# Patient Record
Sex: Female | Born: 1995
Health system: Southern US, Community
[De-identification: ages and names within clinical notes are randomized; demographics above are authoritative.]

## PROBLEM LIST (undated history)

## (undated) DIAGNOSIS — G932 Benign intracranial hypertension: Secondary | ICD-10-CM

## (undated) DIAGNOSIS — E669 Obesity, unspecified: Secondary | ICD-10-CM

## (undated) DIAGNOSIS — N926 Irregular menstruation, unspecified: Secondary | ICD-10-CM

## (undated) DIAGNOSIS — Z8619 Personal history of other infectious and parasitic diseases: Secondary | ICD-10-CM

## (undated) DIAGNOSIS — N898 Other specified noninflammatory disorders of vagina: Secondary | ICD-10-CM

## (undated) DIAGNOSIS — N760 Acute vaginitis: Secondary | ICD-10-CM

## (undated) DIAGNOSIS — B9689 Other specified bacterial agents as the cause of diseases classified elsewhere: Secondary | ICD-10-CM

## (undated) DIAGNOSIS — G43909 Migraine, unspecified, not intractable, without status migrainosus: Secondary | ICD-10-CM

## (undated) HISTORY — DX: Other specified bacterial agents as the cause of diseases classified elsewhere: B96.89

## (undated) HISTORY — DX: Other specified noninflammatory disorders of vagina: N89.8

## (undated) HISTORY — DX: Acute vaginitis: N76.0

## (undated) HISTORY — DX: Irregular menstruation, unspecified: N92.6

## (undated) HISTORY — DX: Personal history of other infectious and parasitic diseases: Z86.19

## (undated) HISTORY — PX: TONSILLECTOMY: SUR1361

## (undated) HISTORY — DX: Obesity, unspecified: E66.9

---
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
## 2014-01-15 ENCOUNTER — Telehealth: Payer: Self-pay | Admitting: *Deleted

## 2014-01-19 NOTE — Telephone Encounter (Signed)
Left messages x 3

## 2014-03-19 ENCOUNTER — Ambulatory Visit: Payer: Medicaid Other | Admitting: Adult Health

## 2014-03-19 ENCOUNTER — Encounter: Payer: Self-pay | Admitting: *Deleted

## 2014-03-20 ENCOUNTER — Emergency Department (HOSPITAL_COMMUNITY): Payer: Medicaid Other

## 2014-03-20 ENCOUNTER — Encounter (HOSPITAL_COMMUNITY): Payer: Self-pay | Admitting: Emergency Medicine

## 2014-03-20 ENCOUNTER — Emergency Department (HOSPITAL_COMMUNITY)
Admission: EM | Admit: 2014-03-20 | Discharge: 2014-03-21 | Disposition: A | Discharge status: Home / Self Care | Payer: Medicaid Other | Attending: Emergency Medicine | Admitting: Emergency Medicine

## 2014-03-20 DIAGNOSIS — Z8619 Personal history of other infectious and parasitic diseases: Secondary | ICD-10-CM | POA: Insufficient documentation

## 2014-03-20 DIAGNOSIS — Z793 Long term (current) use of hormonal contraceptives: Secondary | ICD-10-CM | POA: Insufficient documentation

## 2014-03-20 DIAGNOSIS — R51 Headache: Secondary | ICD-10-CM | POA: Diagnosis not present

## 2014-03-20 DIAGNOSIS — R519 Headache, unspecified: Secondary | ICD-10-CM

## 2014-03-20 DIAGNOSIS — Z3202 Encounter for pregnancy test, result negative: Secondary | ICD-10-CM | POA: Insufficient documentation

## 2014-03-20 LAB — POC URINE PREG, ED: Preg Test, Ur: NEGATIVE

## 2014-03-20 MED ORDER — PROMETHAZINE HCL 12.5 MG PO TABS
12.5000 mg | ORAL_TABLET | Freq: Once | ORAL | Status: AC
  Administered 2014-03-20: 12.5 mg via ORAL
  Filled 2014-03-20: qty 1

## 2014-03-20 MED ORDER — KETOROLAC TROMETHAMINE 10 MG PO TABS
10.0000 mg | ORAL_TABLET | Freq: Once | ORAL | Status: AC
  Administered 2014-03-20: 10 mg via ORAL
  Filled 2014-03-20: qty 1

## 2014-03-20 MED ORDER — HYDROCODONE-ACETAMINOPHEN 5-325 MG PO TABS
2.0000 | ORAL_TABLET | Freq: Once | ORAL | Status: AC
Start: 2014-03-20 — End: 2014-03-20
  Administered 2014-03-20: 2 via ORAL
  Filled 2014-03-20: qty 2

## 2014-03-20 MED ORDER — IBUPROFEN 600 MG PO TABS: 600.0000 mg | ORAL_TABLET | Freq: Four times a day (QID) | ORAL | Status: DC

## 2014-03-20 MED ORDER — HYDROCODONE-ACETAMINOPHEN 5-325 MG PO TABS
1.0000 | ORAL_TABLET | ORAL | Status: DC | PRN
Start: 2014-03-20 — End: 2014-04-17

## 2014-03-20 NOTE — ED Notes (Signed)
Pt c/o intermittent headaches x 2 months. States they only last for a few seconds but come several times a day. States they have increased in frequency and intensity with tonight being the worse. Denies any N/V, photophobia, blurry vision. Reports occasional lightheadedness.

## 2014-03-20 NOTE — ED Notes (Signed)
Pt c/o head pain intermittent over last 2 months, but got worse tonight.

## 2014-03-20 NOTE — ED Notes (Signed)
PA at bedside.

## 2014-03-20 NOTE — Discharge Instructions (Signed)
Your CT scan and exam are negative for ACUTE problems.  Headaches, Frequently Asked Questions MIGRAINE HEADACHES Q: What is migraine? What causes it? How can I treat it? A: Generally, migraine headaches begin as a dull ache. Then they develop into a constant, throbbing, and pulsating pain. You may experience pain at the temples. You may experience pain at the front or back of one or both sides of the head. The pain is usually accompanied by a combination of:  Nausea.  Vomiting.  Sensitivity to light and noise. Some people (about 15%) experience an aura (see below) before an attack. The cause of migraine is believed to be chemical reactions in the brain. Treatment for migraine may include over-the-counter or prescription medications. It may also include self-help techniques. These include relaxation training and biofeedback.  Q: What is an aura? A: About 15% of people with migraine get an "aura". This is a sign of neurological symptoms that occur before a migraine headache. You may see wavy or jagged lines, dots, or flashing lights. You might experience tunnel vision or blind spots in one or both eyes. The aura can include visual or auditory hallucinations (something imagined). It may include disruptions in smell (such as strange odors), taste or touch. Other symptoms include:  Numbness.  A "pins and needles" sensation.  Difficulty in recalling or speaking the correct word. These neurological events may last as long as 60 minutes. These symptoms will fade as the headache begins. Q: What is a trigger? A: Certain physical or environmental factors can lead to or "trigger" a migraine. These include:  Foods.  Hormonal changes.  Weather.  Stress. It is important to remember that triggers are different for everyone. To help prevent migraine attacks, you need to figure out which triggers affect you. Keep a headache diary. This is a good way to track triggers. The diary will help you talk to  your healthcare professional about your condition. Q: Does weather affect migraines? A: Bright sunshine, hot, humid conditions, and drastic changes in barometric pressure may lead to, or "trigger," a migraine attack in some people. But studies have shown that weather does not act as a trigger for everyone with migraines. Q: What is the link between migraine and hormones? A: Hormones start and regulate many of your body's functions. Hormones keep your body in balance within a constantly changing environment. The levels of hormones in your body are unbalanced at times. Examples are during menstruation, pregnancy, or menopause. That can lead to a migraine attack. In fact, about three quarters of all women with migraine report that their attacks are related to the menstrual cycle.  Q: Is there an increased risk of stroke for migraine sufferers? A: The likelihood of a migraine attack causing a stroke is very remote. That is not to say that migraine sufferers cannot have a stroke associated with their migraines. In persons under age 19, the most common associated factor for stroke is migraine headache. But over the course of a person's normal life span, the occurrence of migraine headache may actually be associated with a reduced risk of dying from cerebrovascular disease due to stroke.  Q: What are acute medications for migraine? A: Acute medications are used to treat the pain of the headache after it has started. Examples over-the-counter medications, NSAIDs, ergots, and triptans.  Q: What are the triptans? A: Triptans are the newest class of abortive medications. They are specifically targeted to treat migraine. Triptans are vasoconstrictors. They moderate some chemical reactions in the  brain. The triptans work on receptors in your brain. Triptans help to restore the balance of a neurotransmitter called serotonin. Fluctuations in levels of serotonin are thought to be a main cause of migraine.  Q: Are  over-the-counter medications for migraine effective? A: Over-the-counter, or "OTC," medications may be effective in relieving mild to moderate pain and associated symptoms of migraine. But you should see your caregiver before beginning any treatment regimen for migraine.  Q: What are preventive medications for migraine? A: Preventive medications for migraine are sometimes referred to as "prophylactic" treatments. They are used to reduce the frequency, severity, and length of migraine attacks. Examples of preventive medications include antiepileptic medications, antidepressants, beta-blockers, calcium channel blockers, and NSAIDs (nonsteroidal anti-inflammatory drugs). Q: Why are anticonvulsants used to treat migraine? A: During the past few years, there has been an increased interest in antiepileptic drugs for the prevention of migraine. They are sometimes referred to as "anticonvulsants". Both epilepsy and migraine may be caused by similar reactions in the brain.  Q: Why are antidepressants used to treat migraine? A: Antidepressants are typically used to treat people with depression. They may reduce migraine frequency by regulating chemical levels, such as serotonin, in the brain.  Q: What alternative therapies are used to treat migraine? A: The term "alternative therapies" is often used to describe treatments considered outside the scope of conventional Western medicine. Examples of alternative therapy include acupuncture, acupressure, and yoga. Another common alternative treatment is herbal therapy. Some herbs are believed to relieve headache pain. Always discuss alternative therapies with your caregiver before proceeding. Some herbal products contain arsenic and other toxins. TENSION HEADACHES Q: What is a tension-type headache? What causes it? How can I treat it? A: Tension-type headaches occur randomly. They are often the result of temporary stress, anxiety, fatigue, or anger. Symptoms include  soreness in your temples, a tightening band-like sensation around your head (a "vice-like" ache). Symptoms can also include a pulling feeling, pressure sensations, and contracting head and neck muscles. The headache begins in your forehead, temples, or the back of your head and neck. Treatment for tension-type headache may include over-the-counter or prescription medications. Treatment may also include self-help techniques such as relaxation training and biofeedback. CLUSTER HEADACHES Q: What is a cluster headache? What causes it? How can I treat it? A: Cluster headache gets its name because the attacks come in groups. The pain arrives with little, if any, warning. It is usually on one side of the head. A tearing or bloodshot eye and a runny nose on the same side of the headache may also accompany the pain. Cluster headaches are believed to be caused by chemical reactions in the brain. They have been described as the most severe and intense of any headache type. Treatment for cluster headache includes prescription medication and oxygen. SINUS HEADACHES Q: What is a sinus headache? What causes it? How can I treat it? A: When a cavity in the bones of the face and skull (a sinus) becomes inflamed, the inflammation will cause localized pain. This condition is usually the result of an allergic reaction, a tumor, or an infection. If your headache is caused by a sinus blockage, such as an infection, you will probably have a fever. An x-ray will confirm a sinus blockage. Your caregiver's treatment might include antibiotics for the infection, as well as antihistamines or decongestants.  REBOUND HEADACHES Q: What is a rebound headache? What causes it? How can I treat it? A: A pattern of taking acute headache medications  too often can lead to a condition known as "rebound headache." A pattern of taking too much headache medication includes taking it more than 2 days per week or in excessive amounts. That means more  than the label or a caregiver advises. With rebound headaches, your medications not only stop relieving pain, they actually begin to cause headaches. Doctors treat rebound headache by tapering the medication that is being overused. Sometimes your caregiver will gradually substitute a different type of treatment or medication. Stopping may be a challenge. Regularly overusing a medication increases the potential for serious side effects. Consult a caregiver if you regularly use headache medications more than 2 days per week or more than the label advises. ADDITIONAL QUESTIONS AND ANSWERS Q: What is biofeedback? A: Biofeedback is a self-help treatment. Biofeedback uses special equipment to monitor your body's involuntary physical responses. Biofeedback monitors:  Breathing.  Pulse.  Heart rate.  Temperature.  Muscle tension.  Brain activity. Biofeedback helps you refine and perfect your relaxation exercises. You learn to control the physical responses that are related to stress. Once the technique has been mastered, you do not need the equipment any more. Q: Are headaches hereditary? A: Four out of five (80%) of people that suffer report a family history of migraine. Scientists are not sure if this is genetic or a family predisposition. Despite the uncertainty, a child has a 50% chance of having migraine if one parent suffers. The child has a 75% chance if both parents suffer.  Q: Can children get headaches? A: By the time they reach high school, most young people have experienced some type of headache. Many safe and effective approaches or medications can prevent a headache from occurring or stop it after it has begun.  Q: What type of doctor should I see to diagnose and treat my headache? A: Start with your primary caregiver. Discuss his or her experience and approach to headaches. Discuss methods of classification, diagnosis, and treatment. Your caregiver may decide to recommend you to a  headache specialist, depending upon your symptoms or other physical conditions. Having diabetes, allergies, etc., may require a more comprehensive and inclusive approach to your headache. The [employer] will provide, upon request, a list of [facility] physician members in your state. Document Released: 05/13/2003 Document Revised: 05/15/2011 Document Reviewed: 10/21/2007 [facility] Patient Information 2015 [employer] [place]. This information is not intended to replace advice given to you by your health care provider. Make sure you discuss any questions you have with your health care provider. Please use ibuprofen with each meal and at bed time for the next 3 days, then every 6 hours as needed. Use norco for more severe pain. This medication may cause drowsiness,use with caution. Please call the [facility] [place] for appointment concerning your headaches.

## 2014-03-20 NOTE — ED Provider Notes (Signed)
CSN: [id number]     Arrival date & time 03/20/14  2140 History   First MD Initiated Contact with Patient 03/20/14 2210     Chief Complaint  Patient presents with  . Headache     (Consider location/radiation/quality/duration/timing/severity/associated sxs/prior Treatment) HPI Comments: Pt is a18y/o female who presents to the ED with 2.6months of headaches. She describes the pain at sharpe burst of pain that are in the temporal area to temporal area, and at times down the back of the neck and shoulders. They last a few seconds, but come back. The frequency increases as time goes by.  No hx of injury. No changes in meds or diet. No previous diagnosis of intercranial problem. Pt denies N/V, photophobia, reported.   The history is provided by the patient.    Past Medical History  Diagnosis Date  . Hx of chlamydia infection    Past Surgical History  Procedure Laterality Date  . Tonsillectomy     Family History  Problem Relation Age of Onset  . Hypertension Maternal Grandmother   . Diabetes Maternal Grandfather    History  Substance Use Topics  . Smoking status: Never Smoker   . Smokeless tobacco: Never Used  . Alcohol Use: No   OB History    No data available     Review of Systems  Constitutional: Negative for activity change.       All ROS Neg except as noted in HPI  Eyes: Negative for photophobia and discharge.  Respiratory: Negative for cough, shortness of breath and wheezing.   Cardiovascular: Negative for chest pain and palpitations.  Gastrointestinal: Negative for abdominal pain and blood in stool.  Genitourinary: Negative for dysuria, frequency and hematuria.  Musculoskeletal: Negative for back pain, arthralgias and neck pain.  Skin: Negative.   Neurological: Positive for headaches. Negative for dizziness, seizures and speech difficulty.  Psychiatric/Behavioral: Negative for hallucinations and confusion.      Allergies  Oxycodone  Home Medications   Prior  to Admission medications   Medication Sig Start Date End Date Taking? Authorizing Provider  diphenhydrAMINE (BENADRYL) 25 mg capsule Take 1 capsule (25 mg total) by mouth at bedtime as needed for sleep. 05/15/13   Sunnie Nielsen, MD  Norethin Ace-Eth Estrad-FE (MINASTRIN 24 FE) 1-20 MG-MCG(24) CHEW Chew 1 tablet by mouth daily. 07/11/12   Adline Potter, NP   BP 114/68 mmHg  Pulse 93  Temp(Src) 97.9 F (36.6 C) (Oral)  Resp 18  Ht  (1.626 m)  Wt 230 lb (104.327 kg)  BMI 39.46 kg/m2  SpO2 99%  LMP 03/20/2014 Physical Exam  Constitutional: She is oriented to person, place, and time. She appears well-developed and well-nourished.  Non-toxic appearance.  HENT:  Head: Normocephalic.  Right Ear: Tympanic membrane and external ear normal.  Left Ear: Tympanic membrane and external ear normal.  Eyes: EOM and lids are normal. Pupils are equal, round, and reactive to light.  Neck: Normal range of motion. Neck supple. Carotid bruit is not present.  Cardiovascular: Normal rate, regular rhythm, normal heart sounds, intact distal pulses and normal pulses.   Pulmonary/Chest: Breath sounds normal. No respiratory distress.  Abdominal: Soft. Bowel sounds are normal. There is no tenderness. There is no guarding.  Musculoskeletal: Normal range of motion.  Lymphadenopathy:       Head (right side): No submandibular adenopathy present.       Head (left side): No submandibular adenopathy present.    She has no cervical adenopathy.  Neurological: She is  alert and oriented to person, place, and time. She has normal strength. No cranial nerve deficit or sensory deficit. She exhibits normal muscle tone. Coordination normal.  Gait wnl. No speech changes.   Skin: Skin is warm and dry.  Psychiatric: She has a normal mood and affect. Her speech is normal.  Nursing note and vitals reviewed.   ED Course  Procedures (including critical care time) Labs Review Labs Reviewed  POC URINE PREG, ED    Imaging  Review No results found.   EKG Interpretation None      MDM Vital signs stable. Pulse ox 99% on room air.  Discussed exam with pt. No gross neuro deficit. No ear or throat changes. No problem perfusion. No noted sinus changes. Reviewed eye hx. No hx of problem with vision.  CT head - unremarkable  CT  Scan discussed with patient. Rx for norco and ibuprofen 800mg  given to the patient. [facility]   Final diagnoses:  Headache    *I have reviewed nursing notes, vital signs, and all appropriate lab and imaging results for this patient.[id number]    Duane Trias Garry HeaterM Tiarra Anastacio, PA-C 03/23/14 1302  Vanetta MuldersScott Zackowski, MD 03/25/14 [phone]

## 2014-04-17 ENCOUNTER — Encounter: Payer: Self-pay | Admitting: Adult Health

## 2014-04-17 ENCOUNTER — Ambulatory Visit (INDEPENDENT_AMBULATORY_CARE_PROVIDER_SITE_OTHER): Payer: Medicaid Other | Admitting: Adult Health

## 2014-04-17 VITALS — BP 100/72 | Ht 64.0 in | Wt 242.5 lb

## 2014-04-17 DIAGNOSIS — L298 Other pruritus: Secondary | ICD-10-CM

## 2014-04-17 DIAGNOSIS — N926 Irregular menstruation, unspecified: Secondary | ICD-10-CM

## 2014-04-17 DIAGNOSIS — Z3202 Encounter for pregnancy test, result negative: Secondary | ICD-10-CM

## 2014-04-17 DIAGNOSIS — B9689 Other specified bacterial agents as the cause of diseases classified elsewhere: Secondary | ICD-10-CM

## 2014-04-17 DIAGNOSIS — N760 Acute vaginitis: Secondary | ICD-10-CM

## 2014-04-17 DIAGNOSIS — Z319 Encounter for procreative management, unspecified: Secondary | ICD-10-CM

## 2014-04-17 DIAGNOSIS — N898 Other specified noninflammatory disorders of vagina: Secondary | ICD-10-CM | POA: Insufficient documentation

## 2014-04-17 HISTORY — DX: Irregular menstruation, unspecified: N92.6

## 2014-04-17 HISTORY — DX: Other specified noninflammatory disorders of vagina: N89.8

## 2014-04-17 HISTORY — DX: Other specified bacterial agents as the cause of diseases classified elsewhere: N76.0

## 2014-04-17 HISTORY — DX: Other specified bacterial agents as the cause of diseases classified elsewhere: B96.89

## 2014-04-17 LAB — POCT WET PREP (WET MOUNT): WBC WET PREP: POSITIVE

## 2014-04-17 LAB — POCT URINE PREGNANCY: PREG TEST UR: NEGATIVE

## 2014-04-17 MED ORDER — METRONIDAZOLE 500 MG PO TABS: 500.0000 mg | ORAL_TABLET | Freq: Two times a day (BID) | ORAL | Status: DC

## 2014-04-17 NOTE — Progress Notes (Signed)
Subjective:     Patient ID: Brandi Jenkins, female   DOB: 11/03/1995, 19 y.o.   MRN: [id number]  HPI Brandi Jenkins is a 19 year old white female in complaining of vaginal itch and discharge and irregular periods, sometimes 40 days between them,and she wants to be pregnant.  Review of Systems Patient denies any headaches, hearing loss, fatigue, blurred vision, shortness of breath, chest pain, abdominal pain, problems with bowel movements, urination, or intercourse. No joint pain or mood swings. See HPI for positives. Reviewed past medical,surgical, social and family history. Reviewed medications and allergies.     Objective:   Physical Exam BP 100/72 mmHg  Ht 5\' 4"  (1.626 m)  Wt 242 lb 8 oz (109.997 kg)  BMI 41.60 kg/m2  LMP 01/15/2016UPT negative, Skin warm and dry.Pelvic: external genitalia is normal in appearance no lesions, vagina: white discharge with odor,urethra has no lesions or masses noted, cervix:smooth, uterus: normal size, shape and contour, non tender, no masses felt, adnexa: no masses or tenderness noted. Bladder is non tender and no masses felt. Wet prep: + for clue cells and +WBCs. GC/CHL obtained.     Assessment:     Vaginal discharge Vaginal itch BV Irregular periods Desires pregnancy    Plan:    GC/CHL sent Rx flagyl 500 mg 1 bid x 7 days, no alcohol, review handout on BV   Take folic acid Call with next period will check progesterone level Review handout on preparing for pregnancy

## 2014-04-17 NOTE — Patient Instructions (Signed)
Preparing for Pregnancy Before trying to become pregnant, make an appointment with your health care provider (preconception care). The goal is to help you have a healthy, safe pregnancy. At your first appointment, your health care provider will:   Do a complete physical exam, including a Pap test.  Take a complete medical history.  Give you advice and help you resolve any problems. PRECONCEPTION CHECKLIST Here is a list of the basics to cover with your health care provider at your preconception visit:  Medical history.  Tell your health care provider about any diseases you have had. Many diseases can affect your pregnancy.  Include your partner's medical history and family history.  Make sure you have been tested for sexually transmitted infections (STIs). These can affect your pregnancy. In some cases, they can be passed to your baby. Tell your health care provider about any history of STIs.  Make sure your health care provider knows about any previous problems you have had with conception or pregnancy.  Tell your health care provider about any medicine you take. This includes herbal supplements and over-the-counter medicines.  Make sure all your immunizations are up to date. You may need to make additional appointments.  Ask your health care provider if you need any vaccinations or if there are any you should avoid.  Diet.  It is especially important to eat a healthy, balanced diet with the right nutrients when you are pregnant.  Ask your health care provider to help you get to a healthy weight before pregnancy.  If you are overweight, you are at higher risk for certain complications. These include high blood pressure, diabetes, and preterm birth.  If you are underweight, you are more likely to have a low-birth-weight baby.  Lifestyle.  Tell your health care provider about lifestyle factors such as alcohol use, drug use, or smoking.  Describe any harmful substances you may  be exposed to at work or home. These can include chemicals, pesticides, and radiation.  Mental health.  Let your health care provider know if you have been feeling depressed or anxious.  Let your health care provider know if you have a history of substance abuse.  Let your health care provider know if you do not feel safe at home. HOME INSTRUCTIONS TO PREPARE FOR PREGNANCY Follow your health care provider's advice and instructions.   Keep an accurate record of your menstrual periods. This makes it easier for your health care provider to determine your baby's due date.  Begin taking prenatal vitamins and folic acid supplements daily. Take them as directed by your health care provider.  Eat a balanced diet. Get help from a nutrition counselor if you have questions or need help.  Get regular exercise. Try to be active for at least 30 minutes a day most days of the week.  Quit smoking, if you smoke.  Do not drink alcohol.  Do not take illegal drugs.  Get medical problems, such as diabetes or high blood pressure, under control.  If you have diabetes, make sure you do the following:  Have good blood sugar control. If you have type 1 diabetes, use multiple daily doses of insulin. Do not use split-dose or premixed insulin.  Have an eye exam by a qualified eye care professional trained in caring for people with diabetes.  Get evaluated by your health care provider for cardiovascular disease.  Get to a healthy weight. If you are overweight or obese, reduce your weight with the help of a qualified health   professional such as a Museum/gallery exhibitions officerregistered dietitian. Ask your health care provider what the right weight range is for you. HOW DO I KNOW I AM PREGNANT? You may be pregnant if you have been sexually active and you miss your period. Symptoms of early pregnancy include:   Mild cramping.  Very light vaginal bleeding (spotting).  Feeling unusually tired.  Morning sickness. If you have any of  these symptoms, take a home pregnancy test. These tests look for a hormone called human chorionic gonadotropin (hCG) in your urine. Your body begins to make this hormone during early pregnancy. These tests are very accurate. Wait until at least the first day you miss your period to take one. If you get a positive result, call your health care provider to make appointments for prenatal care. WHAT SHOULD I DO IF I BECOME PREGNANT?  Make an appointment with your health care provider by week 12 of your pregnancy at the latest.  Do not smoke. Smoking can be harmful to your baby.  Do not drink alcoholic beverages. Alcohol is related to a number of birth defects.  Avoid toxic odors and chemicals.  You may continue to have sexual intercourse if it does not cause pain or other problems, such as vaginal bleeding. Document Released: 02/03/2008 Document Revised: 07/07/2013 Document Reviewed: 01/27/2013 [facility] Patient Information 2015 [employer] [place], [place]. This information is not intended to replace advice given to you by your health care provider. Make sure you discuss any questions you have with your health care provider. Bacterial Vaginosis Bacterial vaginosis is a vaginal infection that occurs when the normal balance of bacteria in the vagina is disrupted. It results from an overgrowth of certain bacteria. This is the most common vaginal infection in women of childbearing age. Treatment is important to prevent complications, especially in pregnant women, as it can cause a premature delivery. CAUSES  Bacterial vaginosis is caused by an increase in harmful bacteria that are normally present in smaller amounts in the vagina. Several different kinds of bacteria can cause bacterial vaginosis. However, the reason that the condition develops is not fully understood. RISK FACTORS Certain activities or behaviors can put you at an increased risk of developing bacterial vaginosis, including:  Having a new sex partner  or multiple sex partners.  Douching.  Using an intrauterine device (IUD) for contraception. Women do not get bacterial vaginosis from toilet seats, bedding, swimming pools, or contact with objects around them. SIGNS AND SYMPTOMS  Some women with bacterial vaginosis have no signs or symptoms. Common symptoms include:  Grey vaginal discharge.  A fishlike odor with discharge, especially after sexual intercourse.  Itching or burning of the vagina and vulva.  Burning or pain with urination. DIAGNOSIS  Your health care provider will take a medical history and examine the vagina for signs of bacterial vaginosis. A sample of vaginal fluid may be taken. Your health care provider will look at this sample under a microscope to check for bacteria and abnormal cells. A vaginal pH test may also be done.  TREATMENT  Bacterial vaginosis may be treated with antibiotic medicines. These may be given in the form of a pill or a vaginal cream. A second round of antibiotics may be prescribed if the condition comes back after treatment.  HOME CARE INSTRUCTIONS   Only take over-the-counter or prescription medicines as directed by your health care provider.  If antibiotic medicine was prescribed, take it as directed. Make sure you finish it even if you start to feel better.  Do  not have sex until treatment is completed.  Tell all sexual partners that you have a vaginal infection. They should see their health care provider and be treated if they have problems, such as a mild rash or itching.  Practice safe sex by using condoms and only having one sex partner. SEEK MEDICAL CARE IF:   Your symptoms are not improving after 3 days of treatment.  You have increased discharge or pain.  You have a fever. MAKE SURE YOU:   Understand these instructions.  Will watch your condition.  Will get help right away if you are not doing well or get worse. FOR MORE INFORMATION  Centers for Disease Control and  Prevention, Division of STD Prevention: [employer] American Sexual Health Association (ASHA): www.ashastd.org  Document Released: 02/20/2005 Document Revised: 12/11/2012 Document Reviewed: 10/02/2012 [facility] Patient Information 2015 [place], [place]. This information is not intended to replace advice given to you by your health care provider. Make sure you discuss any questions you have with your health care provider. Take flagyl no sex or alcohol  Call with nex t period , will check progesterone level day 21

## 2014-04-21 LAB — GC/CHLAMYDIA PROBE AMP
Chlamydia trachomatis, NAA: NEGATIVE
Neisseria gonorrhoeae by PCR: NEGATIVE

## 2014-04-22 ENCOUNTER — Telehealth: Payer: Self-pay | Admitting: Adult Health

## 2014-04-22 NOTE — Telephone Encounter (Signed)
Spoke with pt. Pt states she was supposed to let you know when she started her period. She started this am. Thanks!! JSY

## 2014-04-22 NOTE — Telephone Encounter (Signed)
Started today will check progesterone level 3/8 at 10 am, have sex every other day 7-24

## 2014-05-12 ENCOUNTER — Other Ambulatory Visit: Payer: 59

## 2014-05-12 DIAGNOSIS — Z319 Encounter for procreative management, unspecified: Secondary | ICD-10-CM

## 2014-05-13 ENCOUNTER — Telehealth: Payer: Self-pay | Admitting: *Deleted

## 2014-05-13 ENCOUNTER — Telehealth: Payer: Self-pay | Admitting: Adult Health

## 2014-05-13 LAB — PROGESTERONE: PROGESTERONE: 0.4 ng/mL

## 2014-05-13 NOTE — Telephone Encounter (Signed)
Pt aware of labs and need to recheck with next cycle, wants to get appt to talk weight loss

## 2014-05-13 NOTE — Telephone Encounter (Signed)
Left message progesterone 0.4mg  did not ovulate this month

## 2014-05-13 NOTE — Telephone Encounter (Signed)
Pt called and stated that she wanted to get her progesterone level. Pt aware of results but wants to discuss everything with Victorino DikeJennifer. Pt stated that Victorino DikeJennifer told her there was a medication that she could be put on to help her ovulate.

## 2014-05-20 ENCOUNTER — Encounter: Payer: Self-pay | Admitting: Adult Health

## 2014-05-20 ENCOUNTER — Ambulatory Visit (INDEPENDENT_AMBULATORY_CARE_PROVIDER_SITE_OTHER): Payer: 59 | Admitting: Adult Health

## 2014-05-20 VITALS — BP 112/70 | HR 86 | Ht 64.0 in | Wt 242.0 lb

## 2014-05-20 DIAGNOSIS — E669 Obesity, unspecified: Secondary | ICD-10-CM | POA: Diagnosis not present

## 2014-05-20 HISTORY — DX: Obesity, unspecified: E66.9

## 2014-05-20 MED ORDER — PHENTERMINE HCL 37.5 MG PO CAPS: 37.5000 mg | ORAL_CAPSULE | ORAL | Status: DC

## 2014-05-20 NOTE — Patient Instructions (Addendum)

## 2014-05-20 NOTE — Progress Notes (Signed)
Subjective:     Patient ID: Barbie BannerJessica Peaden, female   DOB: 12/02/1995, 19 y.o.   MRN: [id number]  HPI Shanda BumpsJessica is a 19 year old white female in to discuss weight loss.  Review of Systems  Patient denies any headaches, hearing loss, fatigue, blurred vision, shortness of breath, chest pain, abdominal pain, problems with bowel movements, urination, or intercourse. No joint pain or mood swings.  Reviewed past medical,surgical, social and family history. Reviewed medications and allergies.     Objective:   Physical Exam BP 112/70 mmHg  Pulse 86  Ht 5\' 4"  (1.626 m)  Wt 242 lb (109.77 kg)  BMI 41.52 kg/m2  LMP 04/22/2014 Skin warm and dry. Lungs: clear to ausculation bilaterally. Cardiovascular: regular rate and rhythm.   Face time 15 minutes, talking diet changes and risk/benefit of weight loss meds, wants to try adipex, sister had good results with it.She wants to lose weight and then get pregnant.  Assessment:     Obesity     Plan:     Rx adipex 37.5 mg #30 1 daily no refills Use condoms Increase walking Try whole 30 or atkins or weight watchers No sodas,drink water Return in 4 weeks for weight and BP check  Review handout on weight loss

## 2014-06-17 ENCOUNTER — Ambulatory Visit: Payer: 59 | Admitting: Adult Health

## 2014-07-13 ENCOUNTER — Ambulatory Visit: Payer: 59 | Admitting: Adult Health

## 2014-11-19 ENCOUNTER — Encounter (HOSPITAL_COMMUNITY): Payer: Self-pay

## 2014-11-19 ENCOUNTER — Emergency Department (HOSPITAL_COMMUNITY)
Admission: EM | Admit: 2014-11-19 | Discharge: 2014-11-19 | Disposition: A | Discharge status: Home / Self Care | Payer: Medicaid Other | Attending: Emergency Medicine | Admitting: Emergency Medicine

## 2014-11-19 DIAGNOSIS — Z872 Personal history of diseases of the skin and subcutaneous tissue: Secondary | ICD-10-CM | POA: Diagnosis not present

## 2014-11-19 DIAGNOSIS — Z3202 Encounter for pregnancy test, result negative: Secondary | ICD-10-CM | POA: Diagnosis not present

## 2014-11-19 DIAGNOSIS — B3731 Acute candidiasis of vulva and vagina: Secondary | ICD-10-CM

## 2014-11-19 DIAGNOSIS — E669 Obesity, unspecified: Secondary | ICD-10-CM | POA: Insufficient documentation

## 2014-11-19 DIAGNOSIS — B373 Candidiasis of vulva and vagina: Secondary | ICD-10-CM

## 2014-11-19 DIAGNOSIS — Z79899 Other long term (current) drug therapy: Secondary | ICD-10-CM | POA: Diagnosis not present

## 2014-11-19 DIAGNOSIS — N898 Other specified noninflammatory disorders of vagina: Secondary | ICD-10-CM | POA: Diagnosis present

## 2014-11-19 LAB — URINE MICROSCOPIC-ADD ON

## 2014-11-19 LAB — URINALYSIS, ROUTINE W REFLEX MICROSCOPIC
Bilirubin Urine: NEGATIVE
Glucose, UA: NEGATIVE mg/dL
HGB URINE DIPSTICK: NEGATIVE
Ketones, ur: 15 mg/dL — AB
Nitrite: NEGATIVE
PROTEIN: NEGATIVE mg/dL
Specific Gravity, Urine: >1.03 — ABNORMAL HIGH (ref 1.005–1.030)
UROBILINOGEN UA: 0.2 mg/dL (ref 0.0–1.0)
pH: 5.5 (ref 5.0–8.0)

## 2014-11-19 LAB — POC URINE PREG, ED: Preg Test, Ur: NEGATIVE

## 2014-11-19 LAB — WET PREP, GENITAL
CLUE CELLS WET PREP: NONE SEEN
Trich, Wet Prep: NONE SEEN

## 2014-11-19 MED ORDER — FLUCONAZOLE 200 MG PO TABS
200.0000 mg | ORAL_TABLET | Freq: Once | ORAL | Status: AC
Start: 2014-11-19 — End: 2014-11-26

## 2014-11-19 NOTE — ED Notes (Addendum)
Pt states she thinks she has an infection in her vagina, C/O itching,pressure and yellow discharge. Pt states she missed her last period.

## 2014-11-19 NOTE — Discharge Instructions (Signed)

## 2014-11-19 NOTE — ED Provider Notes (Signed)
CSN: [id number]     Arrival date & time 11/19/14  1541 History   First MD Initiated Contact with Patient 11/19/14 1557     Chief Complaint  Patient presents with  . Vaginitis     (Consider location/radiation/quality/duration/timing/severity/associated sxs/prior Treatment) HPI   Brandi Jenkins is a 19 y.o. female who presents to the Emergency Department complaining of vaginal swelling, itching, discharge and pressure.  Symptoms have been present for one week.  She states that she originally noticed a white vaginal discharge and now it appears yellow.  She also reports irritation to her vagina and feels "swollen down there"  She states that she has the same sexual partner and has a monogamous relationship.  She also states that she did not have a menstrual period last month and has taken one home preg test that was negative.  She denies fever, abd pain, vomiting, nausea, vaginal bleeding or back pain.    Past Medical History  Diagnosis Date  . Hx of chlamydia infection   . Vaginal discharge 04/17/2014  . Vaginal itching 04/17/2014  . BV (bacterial vaginosis) 04/17/2014  . Irregular periods 04/17/2014  . Obesity 05/20/2014   Past Surgical History  Procedure Laterality Date  . Tonsillectomy     Family History  Problem Relation Age of Onset  . Hypertension Maternal Grandmother   . COPD Maternal Grandmother   . Diabetes Maternal Grandfather   . Cancer Maternal Grandfather     lung  . Depression Mother    Social History  Substance Use Topics  . Smoking status: Never Smoker   . Smokeless tobacco: Never Used  . Alcohol Use: No   OB History    Gravida Para Term Preterm AB TAB SAB Ectopic Multiple Living   0 0 0 0 0 0 0 0 0 0      Review of Systems  Constitutional: Negative for fever, chills and appetite change.  Respiratory: Negative for shortness of breath.   Cardiovascular: Negative for chest pain.  Gastrointestinal: Negative for nausea, vomiting, abdominal pain and blood in  stool.  Genitourinary: Positive for vaginal discharge. Negative for dysuria, hematuria, flank pain, decreased urine volume, vaginal bleeding, difficulty urinating, genital sores and menstrual problem.  Musculoskeletal: Negative for back pain.  Skin: Negative for color change and rash.  Neurological: Negative for dizziness, weakness and numbness.  Hematological: Negative for adenopathy.  All other systems reviewed and are negative.     Allergies  Oxycodone  Home Medications   Prior to Admission medications   Medication Sig Start Date End Date Taking? Authorizing Provider  phentermine 37.5 MG capsule Take 1 capsule (37.5 mg total) by mouth every morning. 05/20/14   Adline Potter, NP  Prenatal Vit-Fe Fumarate-FA (PRENATAL VITAMIN PO) Take by mouth daily.    Historical Provider, MD   BP 131/83 mmHg  Pulse 89  Temp(Src) 98.2 F (36.8 C) (Oral)  Resp 20  Ht 5\' 4"  (1.626 m)  Wt 238 lb 9.6 oz (108.228 kg)  BMI 40.94 kg/m2  SpO2 100%  LMP 10/05/2014 Physical Exam  Constitutional: She is oriented to person, place, and time. She appears well-developed and well-nourished. No distress.  HENT:  Head: Normocephalic and atraumatic.  Mouth/Throat: Oropharynx is clear and moist.  Neck: Normal range of motion. Neck supple.  Cardiovascular: Normal rate, regular rhythm and intact distal pulses.   No murmur heard. Pulmonary/Chest: Effort normal and breath sounds normal. No respiratory distress.  Abdominal: Soft. She exhibits no distension. There is no tenderness. There is  no rebound and no guarding.  Genitourinary: Uterus normal. There is no rash or tenderness on the right labia. There is no rash or tenderness on the left labia. Cervix exhibits no motion tenderness and no discharge. Right adnexum displays no mass and no tenderness. Left adnexum displays no mass and no tenderness. No bleeding in the vagina. No foreign body around the vagina. Vaginal discharge found.  Musculoskeletal: Normal  range of motion.  Neurological: She is alert and oriented to person, place, and time.  Skin: Skin is warm and dry.  Psychiatric: She has a normal mood and affect.    ED Course  Procedures (including critical care time) Labs Review Labs Reviewed  WET PREP, GENITAL - Abnormal; Notable for the following:    Yeast Wet Prep HPF POC FEW (*)    WBC, Wet Prep HPF POC MANY (*)    All other components within normal limits  URINALYSIS, ROUTINE W REFLEX MICROSCOPIC (NOT AT [facility]) - Abnormal; Notable for the following:    Specific Gravity, Urine >1.030 (*)    Ketones, ur 15 (*)    Leukocytes, UA MODERATE (*)    All other components within normal limits  URINE MICROSCOPIC-ADD ON - Abnormal; Notable for the following:    Squamous Epithelial / LPF MANY (*)    Bacteria, UA FEW (*)    All other components within normal limits  URINE CULTURE  POC URINE PREG, ED  GC/CHLAMYDIA PROBE AMP ([place]) NOT AT [facility]    Imaging Review No results found. I have personally reviewed and evaluated these images and lab results as part of my medical decision-making.   EKG Interpretation None     GC chlamydia and urine cultures pending  MDM   Final diagnoses:  Vaginal candidiasis    Pt is well appearing.  Vaginal d/c appears c/w candidiasis.  Pt is well appearing, non-toxic.  No concerning sx's for PID, TOA, torsion.  Will tx with diflucan.  Low clinical suspicion for STI, pt prefers to wait for culture results.  Agrees to care plan and to return here if needed and to schedule f/u with health dept or family tree.     Pauline Aus, PA-C 11/20/14 2350  Vanetta Mulders, MD 11/21/14 [phone]

## 2014-11-20 LAB — GC/CHLAMYDIA PROBE AMP (~~LOC~~) NOT AT ARMC
Chlamydia: NEGATIVE
NEISSERIA GONORRHEA: NEGATIVE

## 2014-11-21 LAB — URINE CULTURE

## 2016-04-19 ENCOUNTER — Encounter (HOSPITAL_COMMUNITY): Payer: Self-pay | Admitting: Emergency Medicine

## 2016-04-19 ENCOUNTER — Emergency Department (HOSPITAL_COMMUNITY): Payer: 59

## 2016-04-19 ENCOUNTER — Emergency Department (HOSPITAL_COMMUNITY)
Admission: EM | Admit: 2016-04-19 | Discharge: 2016-04-19 | Disposition: A | Discharge status: Home / Self Care | Payer: 59 | Attending: Emergency Medicine | Admitting: Emergency Medicine

## 2016-04-19 DIAGNOSIS — R110 Nausea: Secondary | ICD-10-CM | POA: Diagnosis not present

## 2016-04-19 DIAGNOSIS — R1032 Left lower quadrant pain: Secondary | ICD-10-CM | POA: Insufficient documentation

## 2016-04-19 LAB — CBC WITH DIFFERENTIAL/PLATELET
Basophils Absolute: 0 10*3/uL (ref 0.0–0.1)
Basophils Relative: 0 %
Eosinophils Absolute: 0.1 10*3/uL (ref 0.0–0.7)
Eosinophils Relative: 1 %
HCT: 40.7 % (ref 36.0–46.0)
Hemoglobin: 12.9 g/dL (ref 12.0–15.0)
LYMPHS ABS: 2 10*3/uL (ref 0.7–4.0)
Lymphocytes Relative: 26 %
MCH: 27.1 pg (ref 26.0–34.0)
MCHC: 31.7 g/dL (ref 30.0–36.0)
MCV: 85.5 fL (ref 78.0–100.0)
MONO ABS: 0.5 10*3/uL (ref 0.1–1.0)
MONOS PCT: 7 %
NEUTROS PCT: 66 %
Neutro Abs: 5 10*3/uL (ref 1.7–7.7)
Platelets: 285 10*3/uL (ref 150–400)
RBC: 4.76 MIL/uL (ref 3.87–5.11)
RDW: 14 % (ref 11.5–15.5)
WBC: 7.5 10*3/uL (ref 4.0–10.5)

## 2016-04-19 LAB — WET PREP, GENITAL
Sperm: NONE SEEN
Trich, Wet Prep: NONE SEEN
Yeast Wet Prep HPF POC: NONE SEEN

## 2016-04-19 LAB — URINALYSIS, ROUTINE W REFLEX MICROSCOPIC
BILIRUBIN URINE: NEGATIVE
Glucose, UA: NEGATIVE mg/dL
HGB URINE DIPSTICK: NEGATIVE
KETONES UR: NEGATIVE mg/dL
Leukocytes, UA: NEGATIVE
Nitrite: NEGATIVE
PH: 5 (ref 5.0–8.0)
Protein, ur: NEGATIVE mg/dL
SPECIFIC GRAVITY, URINE: 1.021 (ref 1.005–1.030)

## 2016-04-19 LAB — I-STAT BETA HCG BLOOD, ED (MC, WL, AP ONLY): I-stat hCG, quantitative: <5 m[IU]/mL (ref <5)

## 2016-04-19 LAB — COMPREHENSIVE METABOLIC PANEL
ALBUMIN: 4.2 g/dL (ref 3.5–5.0)
ALK PHOS: 96 U/L (ref 38–126)
ALT: 22 U/L (ref 14–54)
ANION GAP: 9 (ref 5–15)
AST: 26 U/L (ref 15–41)
BILIRUBIN TOTAL: 0.7 mg/dL (ref 0.3–1.2)
BUN: 17 mg/dL (ref 6–20)
CALCIUM: 9.1 mg/dL (ref 8.9–10.3)
CO2: 25 mmol/L (ref 22–32)
CREATININE: 0.69 mg/dL (ref 0.44–1.00)
Chloride: 104 mmol/L (ref 101–111)
GFR calc Af Amer: >60 mL/min (ref >60)
GFR calc non Af Amer: >60 mL/min (ref >60)
GLUCOSE: 92 mg/dL (ref 65–99)
Potassium: 4 mmol/L (ref 3.5–5.1)
SODIUM: 138 mmol/L (ref 135–145)
TOTAL PROTEIN: 7.9 g/dL (ref 6.5–8.1)

## 2016-04-19 LAB — LIPASE, BLOOD: Lipase: 20 U/L (ref 11–51)

## 2016-04-19 MED ORDER — NAPROXEN 500 MG PO TABS: 500.0000 mg | ORAL_TABLET | Freq: Two times a day (BID) | ORAL | 0 refills | Status: DC

## 2016-04-19 MED ORDER — ONDANSETRON 4 MG PO TBDP: 4.0000 mg | ORAL_TABLET | Freq: Three times a day (TID) | ORAL | 0 refills | Status: DC | PRN

## 2016-04-19 MED ORDER — SODIUM CHLORIDE 0.9 % IV BOLUS (SEPSIS)
500.0000 mL | Freq: Once | INTRAVENOUS | Status: AC
  Administered 2016-04-19: 500 mL via INTRAVENOUS

## 2016-04-19 MED ORDER — SODIUM CHLORIDE 0.9 % IV BOLUS (SEPSIS)
1000.0000 mL | Freq: Once | INTRAVENOUS | Status: AC
  Administered 2016-04-19: 1000 mL via INTRAVENOUS

## 2016-04-19 MED ORDER — ONDANSETRON HCL 4 MG/2ML IJ SOLN
4.0000 mg | Freq: Once | INTRAMUSCULAR | Status: AC
  Administered 2016-04-19: 4 mg via INTRAVENOUS
  Filled 2016-04-19: qty 2

## 2016-04-19 MED ORDER — FENTANYL CITRATE (PF) 100 MCG/2ML IJ SOLN
100.0000 ug | Freq: Once | INTRAMUSCULAR | Status: AC
  Administered 2016-04-19: 100 ug via INTRAVENOUS
  Filled 2016-04-19: qty 2

## 2016-04-19 NOTE — ED Provider Notes (Signed)
[facility]-EMER[facility]Y DEPT Provider Note   CSN: [id number] Arrival date & time: 04/19/16  0709     History   Chief Complaint Chief Complaint  Patient presents with  . Abdominal Pain    HPI Brandi Jenkins is a 21 y.o. female.  HPI  21 year old female presents with left lower abdominal pain for 5 days. Has been on and off. Saw her PCP 2 days ago was told she probably has an ovarian cyst. Patient stated that she had some "tests" that were negative and was told to take ibuprofen or Motrin. However this morning shortly after waking up she's been having severe, worsening lower abdominal pain. Radiates across her abdomen and also has some pain in her low back. No urinary symptoms including no dysuria or hematuria. LMP 1/27 but it was an atypical period. No current vaginal bleeding. No vaginal discharge. No fevers. Has had nausea but no vomiting.  Past Medical History:  Diagnosis Date  . BV (bacterial vaginosis) 04/17/2014  . Hx of chlamydia infection   . Irregular periods 04/17/2014  . Obesity 05/20/2014  . Vaginal discharge 04/17/2014  . Vaginal itching 04/17/2014    Patient Active Problem List   Diagnosis Date Noted  . Obesity 05/20/2014  . Vaginal discharge 04/17/2014  . Vaginal itching 04/17/2014  . BV (bacterial vaginosis) 04/17/2014  . Irregular periods 04/17/2014  . Irregular menses 05/06/2013  . Radial head fracture, closed 06/03/2012    Past Surgical History:  Procedure Laterality Date  . TONSILLECTOMY      OB History    Gravida Para Term Preterm AB Living   0 0 0 0 0 0   SAB TAB Ectopic Multiple Live Births   0 0 0 0         Home Medications    Prior to Admission medications   Medication Sig Start Date End Date Taking? Authorizing Provider  naproxen (NAPROSYN) 500 MG tablet Take 1 tablet (500 mg total) by mouth 2 (two) times daily with a meal. 04/19/16   Pricilla Loveless, MD  ondansetron (ZOFRAN ODT) 4 MG disintegrating tablet Take 1 tablet (4 mg total) by mouth every  8 (eight) hours as needed for nausea or vomiting. 04/19/16   Pricilla Loveless, MD    Family History Family History  Problem Relation Age of Onset  . Hypertension Maternal Grandmother   . COPD Maternal Grandmother   . Diabetes Maternal Grandfather   . Cancer Maternal Grandfather     lung  . Depression Mother     Social History Social History  Substance Use Topics  . Smoking status: Never Smoker  . Smokeless tobacco: Never Used  . Alcohol use No     Allergies   Oxycodone   Review of Systems Review of Systems  Constitutional: Negative for fever.  Gastrointestinal: Positive for abdominal pain and nausea. Negative for vomiting.  Genitourinary: Negative for dysuria and hematuria.  Musculoskeletal: Positive for back pain.  All other systems reviewed and are negative.    Physical Exam Updated Vital Signs BP (!) 106/54   Pulse 71   Temp 98 F (36.7 C) (Oral)   Resp 16   Ht 5\' 4"  (1.626 m)   Wt 249 lb (112.9 kg)   LMP 04/01/2016   SpO2 100%   BMI 42.74 kg/m   Physical Exam  Constitutional: She is oriented to person, place, and time. She appears well-developed and well-nourished. No distress.  obese  HENT:  Head: Normocephalic and atraumatic.  Right Ear: External ear normal.  Left Ear: External ear normal.  Nose: Nose normal.  Eyes: Right eye exhibits no discharge. Left eye exhibits no discharge.  Cardiovascular: Normal rate, regular rhythm and normal heart sounds.   Pulmonary/Chest: Effort normal and breath sounds normal.  Abdominal: Soft. There is tenderness in the left lower quadrant. There is no rigidity, no guarding and no CVA tenderness.  Neurological: She is alert and oriented to person, place, and time.  Skin: Skin is warm and dry. She is not diaphoretic.  Nursing note and vitals reviewed.    ED Treatments / Results  Labs (all labs ordered are listed, but only abnormal results are displayed) Labs Reviewed  WET PREP, GENITAL - Abnormal; Notable for  the following:       Result Value   Clue Cells Wet Prep HPF POC PRESENT (*)    WBC, Wet Prep HPF POC FEW (*)    All other components within normal limits  COMPREHENSIVE METABOLIC PANEL  LIPASE, BLOOD  CBC WITH DIFFERENTIAL/PLATELET  URINALYSIS, ROUTINE W REFLEX MICROSCOPIC  I-STAT BETA HCG BLOOD, ED ([facility], [facility], [facility] ONLY)  GC/CHLAMYDIA PROBE AMP ([place]) NOT AT [facility]    EKG  EKG Interpretation None       Radiology US Transvaginal Non-ob  Result Date: 04/19/2016 CLINICAL DATA:  Left lower quadrant pain.  Nausea, vomiting. EXAM: TRANSABDOMINAL AND TRANSVAGINAL ULTRASOUND OF PELVIS TECHNIQUE: Both transabdominal and transvaginal ultrasound examinations of the pelvis were performed. Transabdominal technique was performed for global imaging of the pelvis including uterus, ovaries, adnexal regions, and pelvic cul-de-sac. It was necessary to proceed with endovaginal exam following the transabdominal exam to visualize the endometrium. COMPARISON:  None FINDINGS: Uterus Measurements: 8.1 x 3.5 x 4.1 cm. No fibroids or other mass visualized. Endometrium Thickness: 5 mm in thickness.  No focal abnormality visualized. Right ovary Measurements: 10.7 x 2.8 x 2.5 cm. Normal appearance/no adnexal mass. Left ovary Measurements: 3.4 x 2.5 x 2.1 cm. Small follicles. Normal appearance/no adnexal mass. Other findings Trace free fluid in the pelvis IMPRESSION: Unremarkable pelvic ultrasound. Electronically Signed   By: Charlett Nose M.D.   On: 04/19/2016 10:42   US Pelvis Complete  Result Date: 04/19/2016 CLINICAL DATA:  Left lower quadrant pain.  Nausea, vomiting. EXAM: TRANSABDOMINAL AND TRANSVAGINAL ULTRASOUND OF PELVIS TECHNIQUE: Both transabdominal and transvaginal ultrasound examinations of the pelvis were performed. Transabdominal technique was performed for global imaging of the pelvis including uterus, ovaries, adnexal regions, and pelvic cul-de-sac. It was necessary to proceed with endovaginal exam  following the transabdominal exam to visualize the endometrium. COMPARISON:  None FINDINGS: Uterus Measurements: 8.1 x 3.5 x 4.1 cm. No fibroids or other mass visualized. Endometrium Thickness: 5 mm in thickness.  No focal abnormality visualized. Right ovary Measurements: 10.7 x 2.8 x 2.5 cm. Normal appearance/no adnexal mass. Left ovary Measurements: 3.4 x 2.5 x 2.1 cm. Small follicles. Normal appearance/no adnexal mass. Other findings Trace free fluid in the pelvis IMPRESSION: Unremarkable pelvic ultrasound. Electronically Signed   By: Charlett Nose M.D.   On: 04/19/2016 10:42   [place] Art/ven Flow Abd Pelv Doppler  Result Date: 04/19/2016 CLINICAL DATA:  Left lower quadrant pain.  Nausea, vomiting. EXAM: TRANSABDOMINAL AND TRANSVAGINAL ULTRASOUND OF PELVIS TECHNIQUE: Both transabdominal and transvaginal ultrasound examinations of the pelvis were performed. Transabdominal technique was performed for global imaging of the pelvis including uterus, ovaries, adnexal regions, and pelvic cul-de-sac. It was necessary to proceed with endovaginal exam following the transabdominal exam to visualize the endometrium. COMPARISON:  None FINDINGS: Uterus  Measurements: 8.1 x 3.5 x 4.1 cm. No fibroids or other mass visualized. Endometrium Thickness: 5 mm in thickness.  No focal abnormality visualized. Right ovary Measurements: 10.7 x 2.8 x 2.5 cm. Normal appearance/no adnexal mass. Left ovary Measurements: 3.4 x 2.5 x 2.1 cm. Small follicles. Normal appearance/no adnexal mass. Other findings Trace free fluid in the pelvis IMPRESSION: Unremarkable pelvic ultrasound. Electronically Signed   By: Charlett NoseKevin  Dover M.D.   On: 04/19/2016 10:42    Procedures Procedures (including critical care time)  Medications Ordered in ED Medications  sodium chloride 0.9 % bolus 500 mL (not administered)  ondansetron (ZOFRAN) injection 4 mg (4 mg Intravenous Given 04/19/16 0804)  sodium chloride 0.9 % bolus 1,000 mL (1,000 mLs Intravenous New  Bag/Given 04/19/16 0804)  fentaNYL (SUBLIMAZE) injection 100 mcg (100 mcg Intravenous Given 04/19/16 0804)     Initial Impression / Assessment and Plan / ED Course  I have reviewed the triage vital signs and the nursing notes.  Pertinent labs & imaging results that were available during my care of the patient were reviewed by me and considered in my medical decision making (see chart for details).  Clinical Course as of Apr 20 1111  Wed Apr 19, 2016  [id number] Most likely a cyst, will check labs, urine, POC preg. Fentanyl for pain. Pelvic exam, likely ovarian, given worsening pain will get u/s  [SG]  0839 Gu exam without discharge but does reproduce pain. Will do u/s  [SG]    Clinical Course User Index [SG] Pricilla LovelessScott Gracie Gupta, MD    Patient's pain is better. Her ultrasound was unremarkable including no obvious torsion or even large cyst. Pelvic exam was unimpressive otherwise with no significant discharge. She has clue cells on her wet prep but without symptom at discharge a highly doubt this is BV. No hematuria. Given unremarkable finding so far I have low suspicion for serious intra-abdominal pathology such as diverticulitis, colitis, perforation, or renal colic. Discussed with patient, after shared decision making we will hold off on CT then low suspicion and wanting to avoid radiation otherwise. However if her pain does not improve or she gets worse she will likely need one. Discussed return precautions.  Final Clinical Impressions(s) / ED Diagnoses   Final diagnoses:  Abdominal pain, left lower quadrant  Abdominal pain, left lower quadrant  Abdominal pain, left lower quadrant    New Prescriptions New Prescriptions   NAPROXEN (NAPROSYN) 500 MG TABLET    Take 1 tablet (500 mg total) by mouth 2 (two) times daily with a meal.   ONDANSETRON (ZOFRAN ODT) 4 MG DISINTEGRATING TABLET    Take 1 tablet (4 mg total) by mouth every 8 (eight) hours as needed for nausea or vomiting.     Pricilla LovelessScott Jaquelyn Sakamoto,  MD 04/19/16 1115

## 2016-04-19 NOTE — ED Notes (Signed)
Patient advised needed a urine specimen.

## 2016-04-19 NOTE — ED Triage Notes (Signed)
Pt c/o lower abd pain with n/d since Friday. Worse today. Pt seen by pcp Monday and told she may have ovarian cyst.

## 2016-04-19 NOTE — ED Notes (Signed)
EDP aware of discharge vital signs. EDP reported to continue with discharge. Pt ambulated out of ED with steady gait.

## 2016-04-20 ENCOUNTER — Other Ambulatory Visit: Payer: 59 | Admitting: Advanced Practice Midwife

## 2016-04-21 LAB — GC/CHLAMYDIA PROBE AMP (~~LOC~~) NOT AT ARMC
Chlamydia: NEGATIVE
Neisseria Gonorrhea: NEGATIVE

## 2016-05-01 ENCOUNTER — Other Ambulatory Visit: Payer: 59 | Admitting: Women's Health

## 2016-05-01 ENCOUNTER — Encounter: Payer: Self-pay | Admitting: Women's Health

## 2016-06-10 ENCOUNTER — Emergency Department (HOSPITAL_COMMUNITY)
Admission: EM | Admit: 2016-06-10 | Discharge: 2016-06-10 | Disposition: A | Discharge status: Home / Self Care | Payer: 59 | Attending: Emergency Medicine | Admitting: Emergency Medicine

## 2016-06-10 ENCOUNTER — Encounter (HOSPITAL_COMMUNITY): Payer: Self-pay | Admitting: *Deleted

## 2016-06-10 DIAGNOSIS — R1033 Periumbilical pain: Secondary | ICD-10-CM | POA: Diagnosis present

## 2016-06-10 DIAGNOSIS — N946 Dysmenorrhea, unspecified: Secondary | ICD-10-CM | POA: Insufficient documentation

## 2016-06-10 LAB — BASIC METABOLIC PANEL
ANION GAP: 6 (ref 5–15)
BUN: 18 mg/dL (ref 6–20)
CHLORIDE: 106 mmol/L (ref 101–111)
CO2: 26 mmol/L (ref 22–32)
Calcium: 8.9 mg/dL (ref 8.9–10.3)
Creatinine, Ser: 0.77 mg/dL (ref 0.44–1.00)
GFR calc non Af Amer: >60 mL/min (ref >60)
GLUCOSE: 86 mg/dL (ref 65–99)
Potassium: 3.7 mmol/L (ref 3.5–5.1)
Sodium: 138 mmol/L (ref 135–145)

## 2016-06-10 LAB — CBC WITH DIFFERENTIAL/PLATELET
BASOS ABS: 0 10*3/uL (ref 0.0–0.1)
BASOS PCT: 0 %
Eosinophils Absolute: 0.1 10*3/uL (ref 0.0–0.7)
Eosinophils Relative: 1 %
HEMATOCRIT: 37.1 % (ref 36.0–46.0)
HEMOGLOBIN: 11.9 g/dL — AB (ref 12.0–15.0)
LYMPHS PCT: 32 %
Lymphs Abs: 4 10*3/uL (ref 0.7–4.0)
MCH: 27.5 pg (ref 26.0–34.0)
MCHC: 32.1 g/dL (ref 30.0–36.0)
MCV: 85.9 fL (ref 78.0–100.0)
MONO ABS: 0.9 10*3/uL (ref 0.1–1.0)
MONOS PCT: 8 %
NEUTROS ABS: 7.4 10*3/uL (ref 1.7–7.7)
NEUTROS PCT: 59 %
Platelets: 269 10*3/uL (ref 150–400)
RBC: 4.32 MIL/uL (ref 3.87–5.11)
RDW: 14.8 % (ref 11.5–15.5)
WBC: 12.5 10*3/uL — ABNORMAL HIGH (ref 4.0–10.5)

## 2016-06-10 LAB — I-STAT BETA HCG BLOOD, ED (MC, WL, AP ONLY): I-stat hCG, quantitative: <5 m[IU]/mL (ref <5)

## 2016-06-10 MED ORDER — HYDROMORPHONE HCL 1 MG/ML IJ SOLN
1.0000 mg | Freq: Once | INTRAMUSCULAR | Status: AC
  Administered 2016-06-10: 1 mg via INTRAVENOUS
  Filled 2016-06-10: qty 1

## 2016-06-10 MED ORDER — ONDANSETRON HCL 4 MG/2ML IJ SOLN
4.0000 mg | Freq: Once | INTRAMUSCULAR | Status: AC
  Administered 2016-06-10: 4 mg via INTRAVENOUS
  Filled 2016-06-10: qty 2

## 2016-06-10 MED ORDER — TRAMADOL HCL 50 MG PO TABS: 50.0000 mg | ORAL_TABLET | Freq: Four times a day (QID) | ORAL | 0 refills | Status: DC | PRN

## 2016-06-10 NOTE — Discharge Instructions (Signed)
Follow-up with your OB/GYN doctor this week

## 2016-06-10 NOTE — ED Provider Notes (Addendum)
[facility] Provider Note   CSN: [id number] Arrival date & time: 06/10/16  2106     History   Chief Complaint Chief Complaint  Patient presents with  . Back Pain    HPI Brandi Jenkins is a 20 y.o. female.  Patient complains of lower abdominal pain back pain and heavy menses.    Abdominal Pain   This is a new problem. The current episode started 12 to 24 hours ago. The problem occurs constantly. The problem has not changed since onset.The pain is associated with an unknown factor. The pain is located in the periumbilical region. The quality of the pain is aching. The pain is at a severity of 5/10. The pain is moderate. Pertinent negatives include diarrhea, flatus, frequency, hematuria and headaches.    Past Medical History:  Diagnosis Date  . BV (bacterial vaginosis) 04/17/2014  . Hx of chlamydia infection   . Irregular periods 04/17/2014  . Obesity 05/20/2014  . Vaginal discharge 04/17/2014  . Vaginal itching 04/17/2014    Patient Active Problem List   Diagnosis Date Noted  . Obesity 05/20/2014  . Vaginal discharge 04/17/2014  . Vaginal itching 04/17/2014  . BV (bacterial vaginosis) 04/17/2014  . Irregular periods 04/17/2014  . Irregular menses 05/06/2013  . Radial head fracture, closed 06/03/2012    Past Surgical History:  Procedure Laterality Date  . TONSILLECTOMY      OB History    Gravida Para Term Preterm AB Living   0 0 0 0 0 0   SAB TAB Ectopic Multiple Live Births   0 0 0 0         Home Medications    Prior to Admission medications   Medication Sig Start Date End Date Taking? Authorizing Provider  naproxen (NAPROSYN) 500 MG tablet Take 1 tablet (500 mg total) by mouth 2 (two) times daily with a meal. 04/19/16   Pricilla Loveless, MD  ondansetron (ZOFRAN ODT) 4 MG disintegrating tablet Take 1 tablet (4 mg total) by mouth every 8 (eight) hours as needed for nausea or vomiting. 04/19/16   Pricilla Loveless, MD  traMADol (ULTRAM) 50 MG tablet Take 1  tablet (50 mg total) by mouth every 6 (six) hours as needed. 06/10/16   Bethann Berkshire, MD    Family History Family History  Problem Relation Age of Onset  . Hypertension Maternal Grandmother   . COPD Maternal Grandmother   . Diabetes Maternal Grandfather   . Cancer Maternal Grandfather     lung  . Depression Mother     Social History Social History  Substance Use Topics  . Smoking status: Never Smoker  . Smokeless tobacco: Never Used  . Alcohol use No     Allergies   Oxycodone   Review of Systems Review of Systems  Constitutional: Negative for appetite change and fatigue.  HENT: Negative for congestion, ear discharge and sinus pressure.   Eyes: Negative for discharge.  Respiratory: Negative for cough.   Cardiovascular: Negative for chest pain.  Gastrointestinal: Positive for abdominal pain. Negative for diarrhea and flatus.  Genitourinary: Positive for vaginal bleeding. Negative for frequency and hematuria.  Musculoskeletal: Negative for back pain.  Skin: Negative for rash.  Neurological: Negative for seizures and headaches.  Psychiatric/Behavioral: Negative for hallucinations.     Physical Exam Updated Vital Signs BP 133/74 (BP Location: Right Arm)   Pulse 95   Temp 98.2 F (36.8 C) (Oral)   Resp 20   Ht  (1.626 m)   Wt 250  lb (113.4 kg)   LMP 03/20/2016   SpO2 100%   BMI 42.91 kg/m   Physical Exam  Constitutional: She is oriented to person, place, and time. She appears well-developed.  HENT:  Head: Normocephalic.  Eyes: Conjunctivae and EOM are normal. No scleral icterus.  Neck: Neck supple. No thyromegaly present.  Cardiovascular: Normal rate and regular rhythm.  Exam reveals no gallop and no friction rub.   No murmur heard. Pulmonary/Chest: No stridor. She has no wheezes. She has no rales. She exhibits no tenderness.  Abdominal: She exhibits no distension. There is tenderness. There is no rebound.  Musculoskeletal: Normal range of motion. She  exhibits no edema.  Lymphadenopathy:    She has no cervical adenopathy.  Neurological: She is oriented to person, place, and time. She exhibits normal muscle tone. Coordination normal.  Skin: No rash noted. No erythema.  Psychiatric: She has a normal mood and affect. Her behavior is normal.     ED Treatments / Results  Labs (all labs ordered are listed, but only abnormal results are displayed) Labs Reviewed  CBC WITH DIFFERENTIAL/PLATELET - Abnormal; Notable for the following:       Result Value   WBC 12.5 (*)    Hemoglobin 11.9 (*)    All other components within normal limits  BASIC METABOLIC PANEL  I-STAT BETA HCG BLOOD, ED ([facility], [facility], [facility] ONLY)    EKG  EKG Interpretation None       Radiology No results found.  Procedures Procedures (including critical care time)  Medications Ordered in ED Medications  HYDROmorphone (DILAUDID) injection 1 mg (1 mg Intravenous Given 06/10/16 2210)  ondansetron (ZOFRAN) injection 4 mg (4 mg Intravenous Given 06/10/16 2210)     Initial Impression / Assessment and Plan / ED Course  I have reviewed the triage vital signs and the nursing notes.  Pertinent labs & imaging results that were available during my care of the patient were reviewed by me and considered in my medical decision making (see chart for details).     Patient with lower abdominal pain and back pain and heavy menses. Labs unremarkable. Suspect painful menses. Will give patient some Ultram and will follow-up with PCP  Final Clinical Impressions(s) / ED Diagnoses   Final diagnoses:  Menses painful    New Prescriptions New Prescriptions   TRAMADOL (ULTRAM) 50 MG TABLET    Take 1 tablet (50 mg total) by mouth every 6 (six) hours as needed.     Bethann Berkshire, MD 06/10/16 1610    Bethann Berkshire, MD 06/10/16 [phone]

## 2016-06-10 NOTE — ED Triage Notes (Signed)
Pt reports lower abdominal pain since yesterday and low back pain that started today. Pt reports she started spotting 2 days ago and started vaginal bleeding today. Pt does not know if this is her period because she hasn't had one since January 15 th. Pt changing 3 tampons in the last hour. Pt denies any urinary symptoms. Pt has taken a home pregnancy test and it was negative.

## 2016-07-27 IMAGING — CT CT HEAD W/O CM
1 of 2 series · 15 of 30 positions shown, 19 images · non-contrast
Comparison: None.

CLINICAL DATA: Chronic onset of headache and dizziness for 2
months. Initial encounter.

EXAM:
CT HEAD WITHOUT CONTRAST
TECHNIQUE: Contiguous axial images were obtained from the base of the skull
through the vertex without intravenous contrast.

[Series 3: headtrauma 2.4 h60s · axial · 0.46mm/px · z∈[+690,+816]mm · 15 of 60 slices shown, 19 images]
[im 4/60  brain]
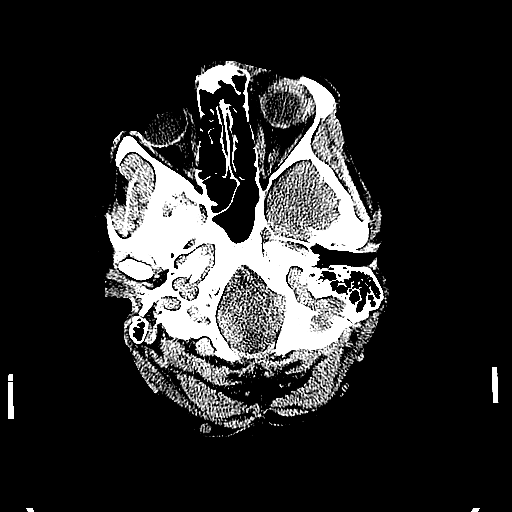
[im 4/60  bone]
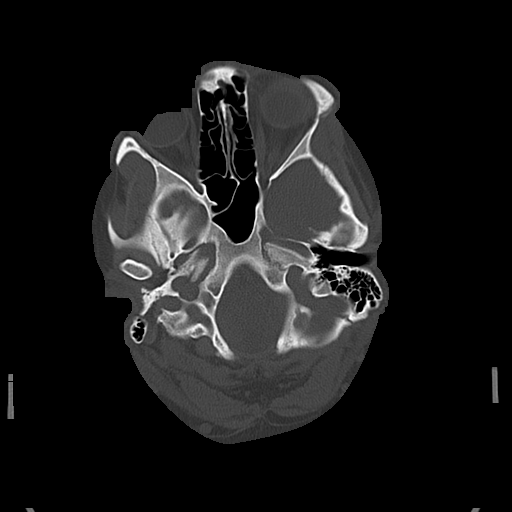
[im 7/60  brain]
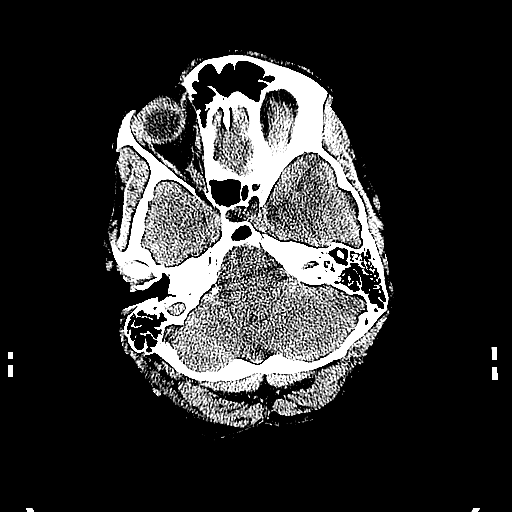
[im 10/60  brain]
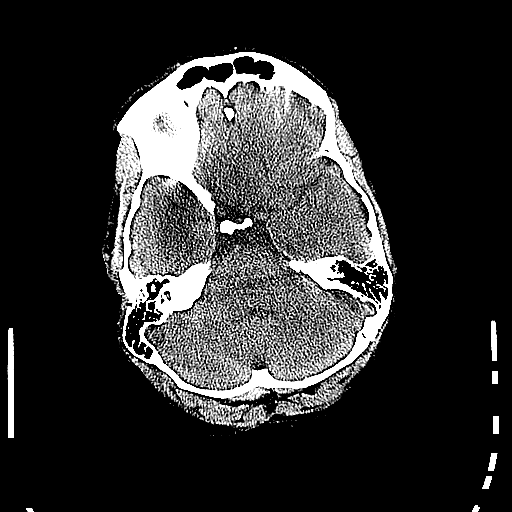
[im 13/60  brain]
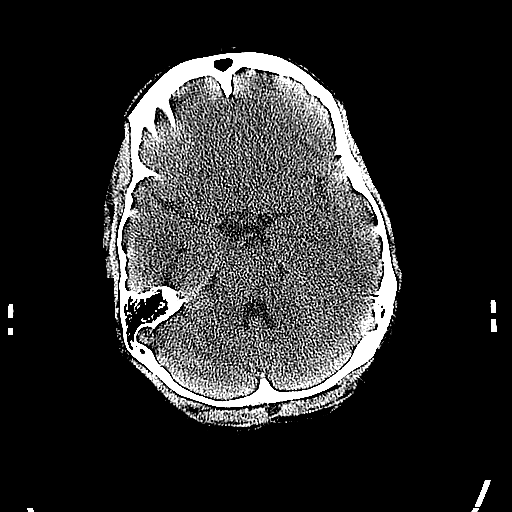
[im 19/60  brain]
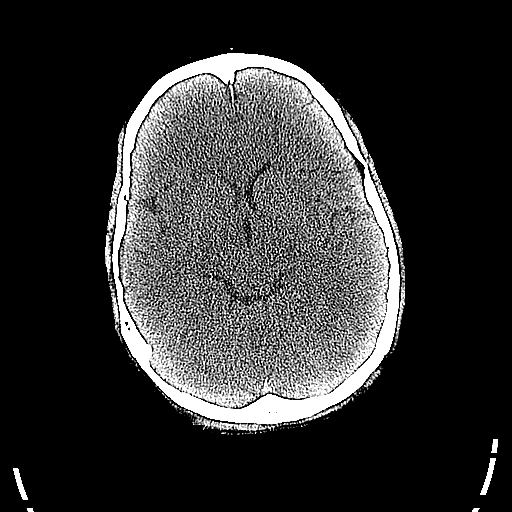
[im 19/60  bone]
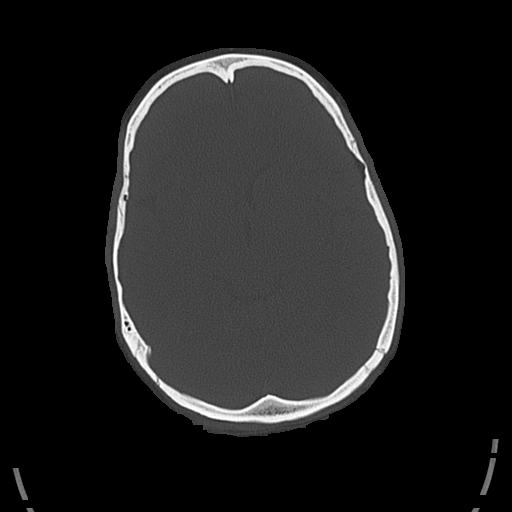
[im 22/60  brain]
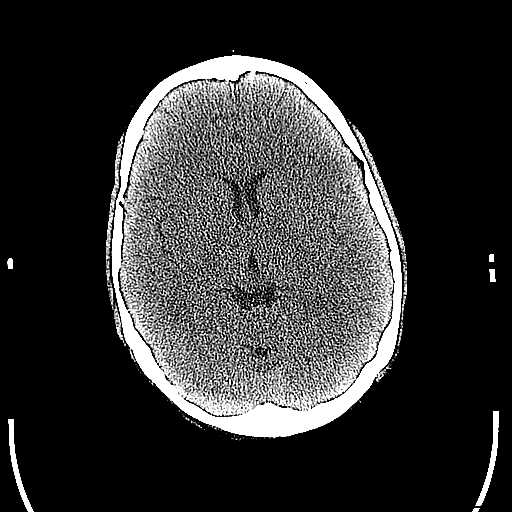
[im 25/60  brain]
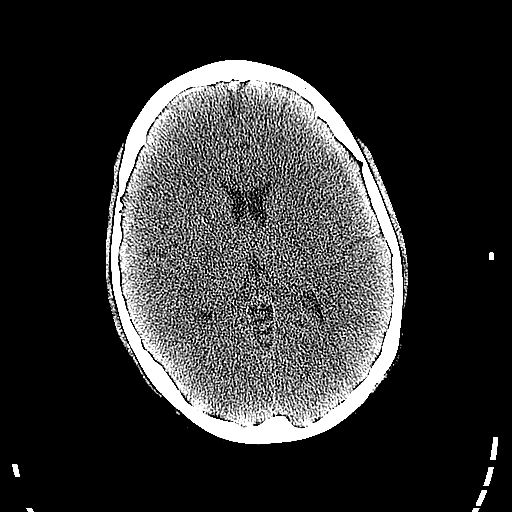
[im 28/60  brain]
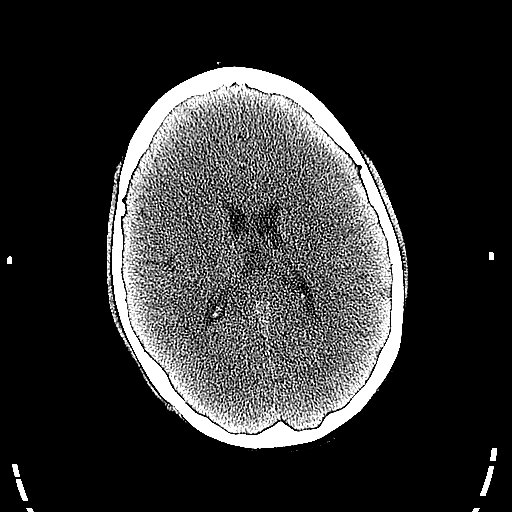
[im 32/60  brain]
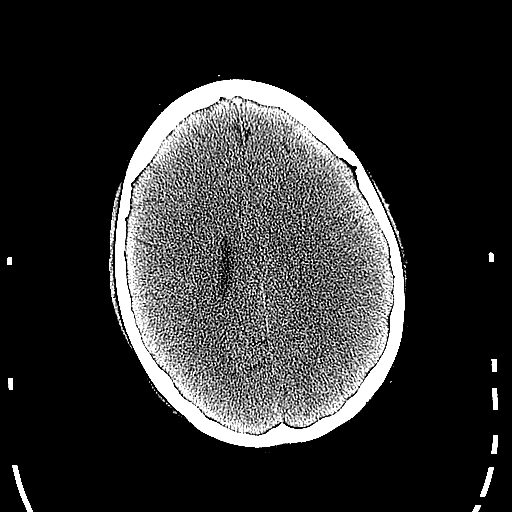
[im 32/60  bone]
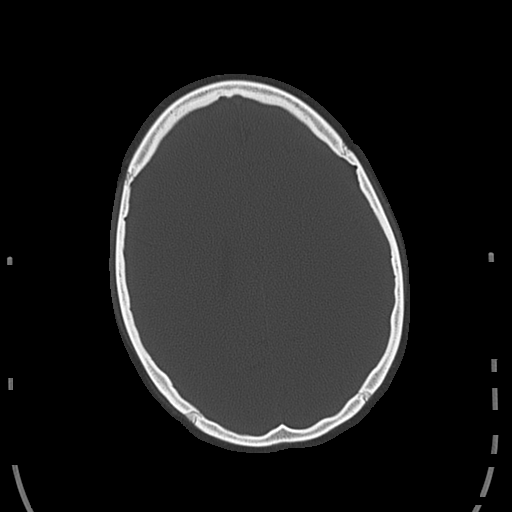
[im 35/60  brain]
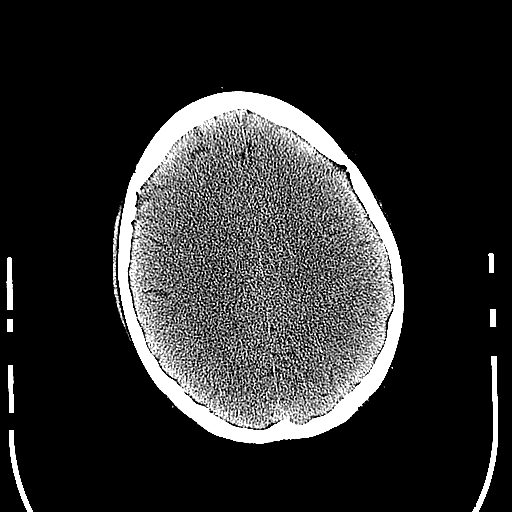
[im 38/60  brain]
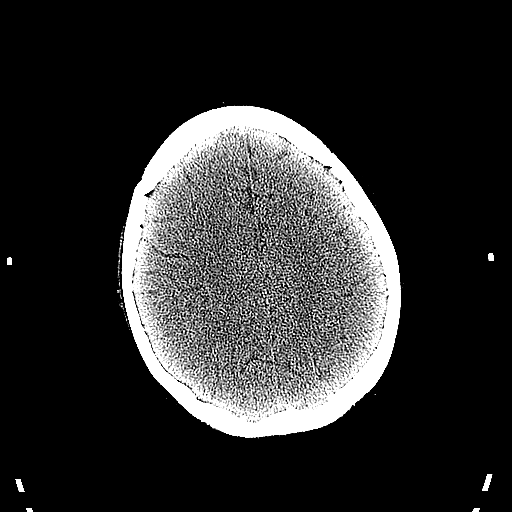
[im 47/60  brain]
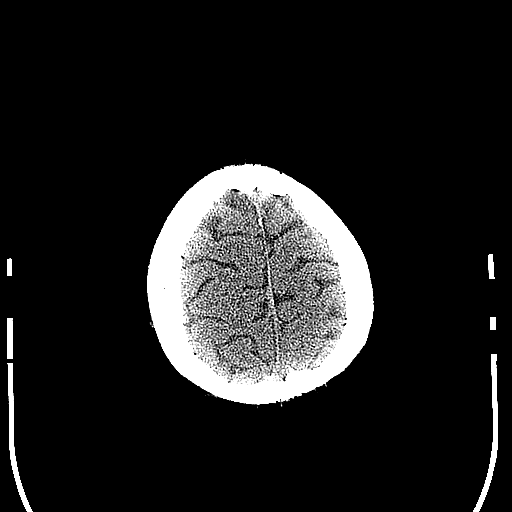
[im 50/60  brain]
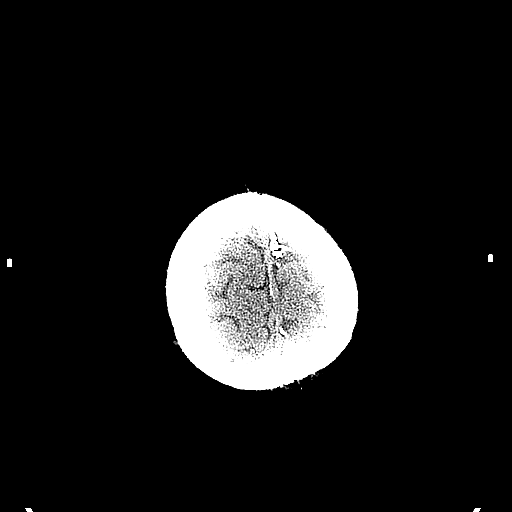
[im 50/60  bone]
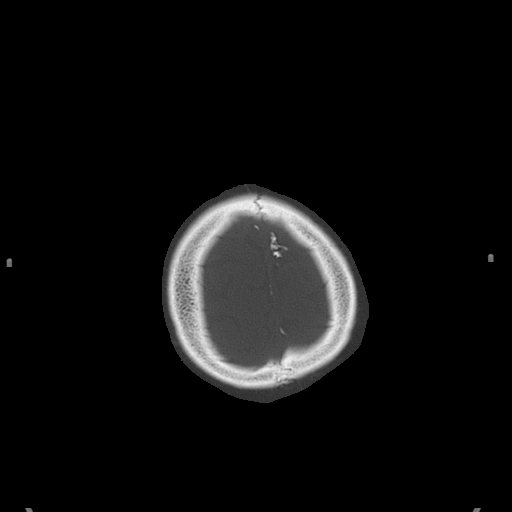
[im 53/60  brain]
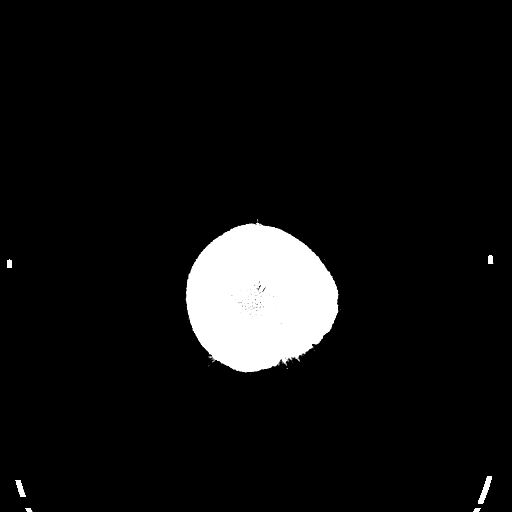
[im 56/60  brain]
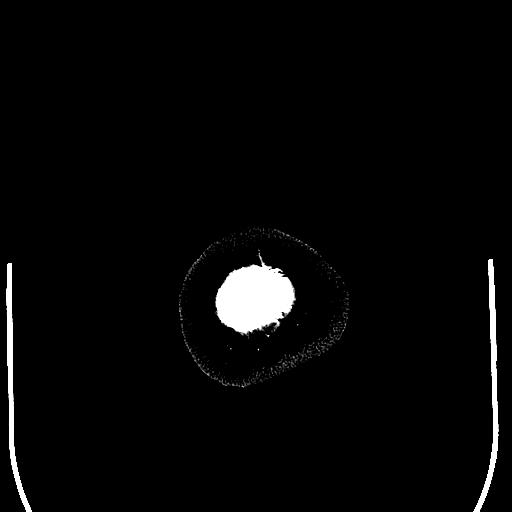

[15 of 30 positions shown; findings below may reference images not displayed]

FINDINGS: There is no evidence of acute infarction, mass lesion, or intra- or
extra-axial hemorrhage on CT.

The posterior fossa, including the cerebellum, brainstem and fourth
ventricle, is within normal limits. The third and lateral
ventricles, and basal ganglia are unremarkable in appearance. The
cerebral hemispheres are symmetric in appearance, with normal
gray-white differentiation. No mass effect or midline shift is seen.

There is no evidence of fracture; visualized osseous structures are
unremarkable in appearance. The visualized portions of the orbits
are within normal limits. The paranasal sinuses and mastoid air
cells are well-aerated. No significant soft tissue abnormalities are
seen.
IMPRESSION: Unremarkable noncontrast CT of the head.

## 2017-07-30 ENCOUNTER — Emergency Department (HOSPITAL_COMMUNITY)
Admission: EM | Admit: 2017-07-30 | Discharge: 2017-07-30 | Disposition: A | Discharge status: Home / Self Care | Payer: 59 | Attending: Emergency Medicine | Admitting: Emergency Medicine

## 2017-07-30 ENCOUNTER — Encounter (HOSPITAL_COMMUNITY): Payer: Self-pay | Admitting: Emergency Medicine

## 2017-07-30 ENCOUNTER — Other Ambulatory Visit: Payer: Self-pay

## 2017-07-30 ENCOUNTER — Emergency Department (HOSPITAL_COMMUNITY): Payer: 59

## 2017-07-30 DIAGNOSIS — J209 Acute bronchitis, unspecified: Secondary | ICD-10-CM

## 2017-07-30 DIAGNOSIS — R05 Cough: Secondary | ICD-10-CM | POA: Diagnosis present

## 2017-07-30 MED ORDER — AZITHROMYCIN 250 MG PO TABS
500.0000 mg | ORAL_TABLET | Freq: Once | ORAL | Status: AC
  Administered 2017-07-30: 500 mg via ORAL
  Filled 2017-07-30: qty 2

## 2017-07-30 MED ORDER — AZITHROMYCIN 250 MG PO TABS: 250.0000 mg | ORAL_TABLET | Freq: Every day | ORAL | 0 refills | Status: DC

## 2017-07-30 MED ORDER — ALBUTEROL SULFATE HFA 108 (90 BASE) MCG/ACT IN AERS
2.0000 | INHALATION_SPRAY | Freq: Once | RESPIRATORY_TRACT | Status: AC
  Administered 2017-07-30: 2 via RESPIRATORY_TRACT
  Filled 2017-07-30: qty 6.7

## 2017-07-30 NOTE — ED Triage Notes (Signed)
Patient complaining of cough and sore throat x 3days.

## 2017-07-30 NOTE — Discharge Instructions (Addendum)
Take your next dose of the antibiotic tomorrow morning.  Continue using your Tessalon and/or the codeine cough syrup that you have at home.  You may use the inhaler as instructed using the spacer, 2 puffs every 4 hours if you are coughing, wheezing or feel tightness in your chest.  Follow-up with your doctor for recheck if your symptoms are not improving with this treatment.

## 2017-08-01 NOTE — ED Provider Notes (Signed)
[facility] EMERGENCY DEPARTMENT Provider Note   CSN: [id number] Arrival date & time: 07/30/17  1258     History   Chief Complaint Chief Complaint  Patient presents with  . Cough    HPI Brandi Jenkins is a 22 y.o. female with a 4 day history of uri type symptoms which includes low grade subjective fever, wheezing and cough which is becoming productive of a green sputum.  She denies nasal congestion, drainage, but has developed sore throat suspected from the cough frequency.  Symptoms do not include shortness of breath, chest pain,  Nausea, vomiting or diarrhea but reports chest pressure, especially at night when the wheezing and cough worsens.  The patient is taking tessalon and a narcotic cough syrup with some transient improvement as she was seen by an urgent care for this same condition several day ago.    The history is provided by the patient.    Past Medical History:  Diagnosis Date  . BV (bacterial vaginosis) 04/17/2014  . Hx of chlamydia infection   . Irregular periods 04/17/2014  . Obesity 05/20/2014  . Vaginal discharge 04/17/2014  . Vaginal itching 04/17/2014    Patient Active Problem List   Diagnosis Date Noted  . Obesity 05/20/2014  . Vaginal discharge 04/17/2014  . Vaginal itching 04/17/2014  . BV (bacterial vaginosis) 04/17/2014  . Irregular periods 04/17/2014  . Irregular menses 05/06/2013  . Radial head fracture, closed 06/03/2012    Past Surgical History:  Procedure Laterality Date  . TONSILLECTOMY       OB History    Gravida  0   Para  0   Term  0   Preterm  0   AB  0   Living  0     SAB  0   TAB  0   Ectopic  0   Multiple  0   Live Births               Home Medications    Prior to Admission medications   Medication Sig Start Date End Date Taking? Authorizing Provider  azithromycin (ZITHROMAX) 250 MG tablet Take 1 tablet (250 mg total) by mouth daily. Take one tablet daily for 4 days 07/31/17   Burgess Amor, PA-C  naproxen  (NAPROSYN) 500 MG tablet Take 1 tablet (500 mg total) by mouth 2 (two) times daily with a meal. 04/19/16   Pricilla Loveless, MD  ondansetron (ZOFRAN ODT) 4 MG disintegrating tablet Take 1 tablet (4 mg total) by mouth every 8 (eight) hours as needed for nausea or vomiting. 04/19/16   Pricilla Loveless, MD  traMADol (ULTRAM) 50 MG tablet Take 1 tablet (50 mg total) by mouth every 6 (six) hours as needed. 06/10/16   Bethann Berkshire, MD    Family History Family History  Problem Relation Age of Onset  . Hypertension Maternal Grandmother   . COPD Maternal Grandmother   . Diabetes Maternal Grandfather   . Cancer Maternal Grandfather        lung  . Depression Mother     Social History Social History   Tobacco Use  . Smoking status: Never Smoker  . Smokeless tobacco: Never Used  Substance Use Topics  . Alcohol use: No  . Drug use: No     Allergies   Oxycodone   Review of Systems Review of Systems  Constitutional: Positive for fever. Negative for chills.  HENT: Positive for sore throat. Negative for congestion, ear pain, rhinorrhea, sinus pressure, trouble swallowing and voice  change.   Eyes: Negative for discharge.  Respiratory: Positive for cough, chest tightness and wheezing. Negative for shortness of breath and stridor.   Cardiovascular: Negative for chest pain, palpitations and leg swelling.  Gastrointestinal: Negative for abdominal pain.  Genitourinary: Negative.      Physical Exam Updated Vital Signs BP (!) 138/92   Pulse 99   Temp 98.7 F (37.1 C) (Oral)   Resp 18   Ht  (1.651 m)   Wt 113.4 kg (250 lb)   LMP 07/23/2017 (Exact Date)   SpO2 98%   BMI 41.60 kg/m   Physical Exam  Constitutional: She is oriented to person, place, and time. She appears well-developed and well-nourished.  HENT:  Head: Normocephalic and atraumatic.  Right Ear: Tympanic membrane and ear canal normal.  Left Ear: Tympanic membrane and ear canal normal.  Nose: No mucosal edema or  rhinorrhea.  Mouth/Throat: Uvula is midline, oropharynx is clear and moist and mucous membranes are normal. No oropharyngeal exudate, posterior oropharyngeal edema, posterior oropharyngeal erythema or tonsillar abscesses.  Eyes: Conjunctivae are normal.  Cardiovascular: Normal rate and normal heart sounds.  Pulmonary/Chest: Effort normal. No respiratory distress. She has wheezes in the left upper field. She has no rales.  Brief expiratory wheeze which resolved with cough.  Abdominal: Soft. There is no tenderness.  Musculoskeletal: Normal range of motion.  Neurological: She is alert and oriented to person, place, and time.  Skin: Skin is warm and dry. No rash noted.  Psychiatric: She has a normal mood and affect.     ED Treatments / Results  Labs (all labs ordered are listed, but only abnormal results are displayed) Labs Reviewed - No data to display  EKG None  Radiology Dg Chest 2 View  Result Date: 07/30/2017 CLINICAL DATA:  Cough EXAM: CHEST - 2 VIEW COMPARISON:  None. FINDINGS: Lungs are clear. Heart size and pulmonary vascular normal. No adenopathy. No pneumothorax. No bone lesions. IMPRESSION: No edema or consolidation. Electronically Signed   By: Bretta Bang III M.D.   On: 07/30/2017 14:27    Procedures Procedures (including critical care time)  Medications Ordered in ED Medications  azithromycin (ZITHROMAX) tablet 500 mg (500 mg Oral Given 07/30/17 1612)  albuterol (PROVENTIL HFA;VENTOLIN HFA) 108 (90 Base) MCG/ACT inhaler 2 puff (2 puffs Inhalation Given 07/30/17 1619)     Initial Impression / Assessment and Plan / ED Course  I have reviewed the triage vital signs and the nursing notes.  Pertinent labs & imaging results that were available during my care of the patient were reviewed by me and considered in my medical decision making (see chart for details).     Pt with acute bronchitis with wheezing, albuterol mdi given with spacer for prn use.  Advised using  other home cough tx, zithromax, first dose given here. Advised f/u pcp for persistent or worsened sx.  No risk factors for PE.   Final Clinical Impressions(s) / ED Diagnoses   Final diagnoses:  Acute bronchitis, unspecified organism    ED Discharge Orders        Ordered    azithromycin (ZITHROMAX) 250 MG tablet  Daily     07/30/17 1600       Burgess Amor, PA-C 08/01/17 1335    Vanetta Mulders, MD 08/02/17 0157

## 2018-05-28 ENCOUNTER — Telehealth: Payer: Self-pay | Admitting: Adult Health

## 2018-05-28 DIAGNOSIS — Z3201 Encounter for pregnancy test, result positive: Secondary | ICD-10-CM

## 2018-05-28 NOTE — Telephone Encounter (Signed)
Patient has taken two pregnancy test, both of them had very light lines and had some bleeding a couple weeks ago, but it only last two days.  She is wanting a visit.  Please advise.  [phone]

## 2018-05-29 NOTE — Telephone Encounter (Signed)
Pt had +HPT, had bleeding weeks ago, will check [facility] today

## 2018-05-30 ENCOUNTER — Telehealth: Payer: Self-pay | Admitting: Obstetrics & Gynecology

## 2018-05-30 ENCOUNTER — Telehealth: Payer: Self-pay | Admitting: Adult Health

## 2018-05-30 LAB — BETA HCG QUANT (REF LAB)

## 2018-05-30 NOTE — Telephone Encounter (Signed)
Patient called, looking for her lab results from yesterday, stated it was a blood pregnancy test.  [phone]

## 2018-05-30 NOTE — Telephone Encounter (Signed)
Pt aware quant was less than 1 so she is not pregnant. Pt voiced understanding. JSY

## 2018-05-30 NOTE — Telephone Encounter (Signed)
Left message that QHCG,<1, so you are not pregnant, if any questions call me

## 2018-12-21 ENCOUNTER — Encounter (HOSPITAL_COMMUNITY): Payer: Self-pay | Admitting: Emergency Medicine

## 2018-12-21 ENCOUNTER — Other Ambulatory Visit: Payer: Self-pay

## 2018-12-21 ENCOUNTER — Emergency Department (HOSPITAL_COMMUNITY)
Admission: EM | Admit: 2018-12-21 | Discharge: 2018-12-21 | Disposition: A | Discharge status: Home / Self Care | Payer: 59 | Attending: Emergency Medicine | Admitting: Emergency Medicine

## 2018-12-21 DIAGNOSIS — R0981 Nasal congestion: Secondary | ICD-10-CM | POA: Diagnosis not present

## 2018-12-21 DIAGNOSIS — M7918 Myalgia, other site: Secondary | ICD-10-CM | POA: Insufficient documentation

## 2018-12-21 DIAGNOSIS — B349 Viral infection, unspecified: Secondary | ICD-10-CM

## 2018-12-21 DIAGNOSIS — U071 COVID-19: Secondary | ICD-10-CM | POA: Diagnosis not present

## 2018-12-21 DIAGNOSIS — R519 Headache, unspecified: Secondary | ICD-10-CM | POA: Diagnosis present

## 2018-12-21 NOTE — ED Notes (Signed)
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

## 2018-12-21 NOTE — ED Triage Notes (Signed)
Patient complains of generalized body aches and headache x 1 days. States she has been exposed by family members that were COVID positive, her sister and grandmother. 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

## 2018-12-21 NOTE — ED Notes (Signed)
Spec to lab

## 2018-12-21 NOTE — ED Provider Notes (Signed)
[facility] EMERGENCY DEPARTMENT Provider Note   CSN: [id number] Arrival date & time: 12/21/18  1438     History   Chief Complaint Chief Complaint  Patient presents with  . Generalized Body Aches  . Headache    HPI Brandi Jenkins is a 23 y.o. female who presents to the ED w/ complaints of headache & generalized body aches that started yesterday.  Patient states headache had gradual onset with steady progression and is generalized.  She of somewhat similar headaches.  Also feels achy all over.  Has had some nasal congestion.  No alleviating or aggravating factors.  Tried Tylenol without relief.  She also thinks she saw a rash in the back of her throat on the driver over but is unsure how long has been here.  She is concerned for COVID-19 as her grandmother and sister both tested positive within the past few days and she has been around them frequently.  Denies fever, chills, ear pain, sore throat, dizziness, numbness, weakness, vomiting, diarrhea, chest pain, trouble breathing, or significant coughing.     HPI  Past Medical History:  Diagnosis Date  . BV (bacterial vaginosis) 04/17/2014  . Hx of chlamydia infection   . Irregular periods 04/17/2014  . Obesity 05/20/2014  . Vaginal discharge 04/17/2014  . Vaginal itching 04/17/2014    Patient Active Problem List   Diagnosis Date Noted  . Obesity 05/20/2014  . Vaginal discharge 04/17/2014  . Vaginal itching 04/17/2014  . BV (bacterial vaginosis) 04/17/2014  . Irregular periods 04/17/2014  . Irregular menses 05/06/2013  . Radial head fracture, closed 06/03/2012    Past Surgical History:  Procedure Laterality Date  . TONSILLECTOMY       OB History    Gravida  0   Para  0   Term  0   Preterm  0   AB  0   Living  0     SAB  0   TAB  0   Ectopic  0   Multiple  0   Live Births               Home Medications    Prior to Admission medications   Medication Sig Start Date End Date Taking? Authorizing  Provider  azithromycin (ZITHROMAX) 250 MG tablet Take 1 tablet (250 mg total) by mouth daily. Take one tablet daily for 4 days 07/31/17   Burgess AmorIdol, Julie, PA-C  naproxen (NAPROSYN) 500 MG tablet Take 1 tablet (500 mg total) by mouth 2 (two) times daily with a meal. 04/19/16   Pricilla LovelessGoldston, Scott, MD  ondansetron (ZOFRAN ODT) 4 MG disintegrating tablet Take 1 tablet (4 mg total) by mouth every 8 (eight) hours as needed for nausea or vomiting. 04/19/16   Pricilla LovelessGoldston, Scott, MD  traMADol (ULTRAM) 50 MG tablet Take 1 tablet (50 mg total) by mouth every 6 (six) hours as needed. 06/10/16   Bethann BerkshireZammit, Joseph, MD    Family History Family History  Problem Relation Age of Onset  . Hypertension Maternal Grandmother   . COPD Maternal Grandmother   . Diabetes Maternal Grandfather   . Cancer Maternal Grandfather        lung  . Depression Mother     Social History Social History   Tobacco Use  . Smoking status: Never Smoker  . Smokeless tobacco: Never Used  Substance Use Topics  . Alcohol use: No  . Drug use: No     Allergies   Oxycodone   Review of Systems Review  of Systems  Constitutional: Negative for chills and fever.  HENT: Positive for congestion. Negative for ear pain and sore throat.   Eyes: Negative for visual disturbance.  Respiratory: Negative for cough and shortness of breath.   Cardiovascular: Negative for leg swelling.  Gastrointestinal: Negative for abdominal pain, constipation, diarrhea and vomiting.  Musculoskeletal: Positive for myalgias.  Skin: Positive for rash (in throat).  Neurological: Positive for headaches. Negative for weakness and numbness.     Physical Exam Updated Vital Signs BP (!) 135/95 (BP Location: Right Arm)   Pulse 98   Temp 98.5 F (36.9 C) (Oral)   Resp 18   Ht 5\' 4"  (1.626 m)   Wt 122.5 kg   LMP 12/21/2018   SpO2 100%   BMI 46.35 kg/m   Physical Exam Vitals signs and nursing note reviewed.  Constitutional:      General: She is not in acute  distress.    Appearance: She is well-developed.  HENT:     Head: Normocephalic and atraumatic.     Right Ear: Ear canal normal. Tympanic membrane is not perforated, erythematous, retracted or bulging.     Left Ear: Ear canal normal. Tympanic membrane is not perforated, erythematous, retracted or bulging.     Ears:     Comments: No mastoid erythema/swelling/tenderness.     Nose:     Right Sinus: No maxillary sinus tenderness or frontal sinus tenderness.     Left Sinus: No maxillary sinus tenderness or frontal sinus tenderness.     Mouth/Throat:     Pharynx: Uvula midline. No oropharyngeal exudate or posterior oropharyngeal erythema.     Tonsils: No tonsillar exudate or tonsillar abscesses.     Comments: Small petechiae present to soft palate.  Posterior oropharynx is symmetric appearing. Patient tolerating own secretions without difficulty. No trismus. No drooling. No hot potato voice. No swelling beneath the tongue, submandibular compartment is soft.  Eyes:     General:        Right eye: No discharge.        Left eye: No discharge.     Conjunctiva/sclera: Conjunctivae normal.     Pupils: Pupils are equal, round, and reactive to light.  Neck:     Musculoskeletal: Normal range of motion and neck supple. No edema or neck rigidity.  Cardiovascular:     Rate and Rhythm: Normal rate and regular rhythm.     Heart sounds: No murmur.  Pulmonary:     Effort: Pulmonary effort is normal. No respiratory distress.     Breath sounds: Normal breath sounds. No wheezing, rhonchi or rales.  Abdominal:     General: There is no distension.     Palpations: Abdomen is soft.     Tenderness: There is no abdominal tenderness.  Lymphadenopathy:     Cervical: No cervical adenopathy.  Skin:    General: Skin is warm and dry.     Findings: No rash.  Neurological:     Mental Status: She is alert.     Comments: CN II through XII grossly intact.  Sensation grossly intact bilateral upper and lower extremities.   Symmetric strength x 4. Ambulatory.   Psychiatric:        Behavior: Behavior normal.      ED Treatments / Results  Labs (all labs ordered are listed, but only abnormal results are displayed) Labs Reviewed - No data to display  EKG None  Radiology No results found.  Procedures Procedures (including critical care time)  Medications Ordered  in ED Medications - No data to display   Initial Impression / Assessment and Plan / ED Course  I have reviewed the triage vital signs and the nursing notes.  Pertinent labs & imaging results that were available during my care of the patient were reviewed by me and considered in my medical decision making (see chart for details).   Patient presents to the emergency department with headache, body aches, nasal congestion, and concern for throat rash.  Known COVID-19 exposures.  Nontoxic-appearing, no apparent distress, vitals WNL with exception of elevated diastolic BP, doubt HTN emergency.  No sinus tenderness, afebrile, doubt acute bacterial sinusitis.  TMs clear.  Centor score 0, doubt strep pharyngitis.  No signs of RPA/PTA.  Suspect soft palate petechiae are more related to a viral syndrome.  Lungs clear to auscultation.  No headache red flags. No meningismus.  No focal neurologic deficits.  Likely viral, possibly COVID-19 with her known exposures, will test in the ED, recommended supportive care at home with continued Motrin/Tylenol.  Discussed hand hygiene & quarantine. I discussed  treatment plan, need for follow-up, and return precautions with the patient. Provided opportunity for questions, patient confirmed understanding and is in agreement with plan.   Aarion Kittrell was evaluated in Emergency Department on 12/21/2018 for the symptoms described in the history of present illness. He/she was evaluated in the context of the global COVID-19 pandemic, which necessitated consideration that the patient might be at risk for infection with the  SARS-CoV-2 virus that causes COVID-19. Institutional protocols and algorithms that pertain to the evaluation of patients at risk for COVID-19 are in a state of rapid change based on information released by regulatory bodies including the CDC and federal and state organizations. These policies and algorithms were followed during the patient's care in the ED.   Final Clinical Impressions(s) / ED Diagnoses   Final diagnoses:  Viral illness    ED Discharge Orders    None       Cherly Anderson, PA-C 12/21/18 1534    Eber Hong, MD 12/22/18 [phone]

## 2018-12-21 NOTE — Discharge Instructions (Addendum)
We suspect your symptoms are due to a virus, possibly covid 19.  Please take motrin/tylenol per over the counter dosing to help with discomfort.  We have tested you for coronavirus today- we will call you if results are positive. We are instruction people with COVID 19 to quarantine themselves for 14 days- please be sure to quarantine while waiting for tests results. You may be able to discontinue self quarantine if the following conditions are met:   Persons with COVID-19 who have symptoms and were directed to care for themselves at home may discontinue home isolation under the  following conditions: - It has been at least 7 days have passed since symptoms first appeared. - AND at least 3 days (72 hours) have passed since recovery defined as resolution of fever without the use of fever-reducing medications and improvement in respiratory symptoms (e.g., cough, shortness of breath)  Please follow the below quarantine instructions.   Please follow up with primary care within 3-5 days for re-evaluation- call prior to going to the office to make them aware of your symptoms. Return to the ER for new or worsening symptoms including but not limited to increased work of breathing, fever, chest pain, passing out, or any other concerns.       Person Under Monitoring Name: Brandi Jenkins  Location: [place] [address]   Infection Prevention Recommendations for Individuals Confirmed to have, or Being Evaluated for, 2019 Novel Coronavirus (COVID-19) Infection Who Receive Care at Home  Individuals who are confirmed to have, or are being evaluated for, COVID-19 should follow the prevention steps below until a healthcare provider or local or state health department says they can return to normal activities.  Stay home except to get medical care You should restrict activities outside your home, except for getting medical care. Do not go to work, school, or public areas, and do not use public  transportation or taxis.  Call ahead before visiting your doctor Before your medical appointment, call the healthcare provider and tell them that you have, or are being evaluated for, COVID-19 infection. This will help the healthcare providers office take steps to keep other people from getting infected. Ask your healthcare provider to call the local or state health department.  Monitor your symptoms Seek prompt medical attention if your illness is worsening (e.g., difficulty breathing). Before going to your medical appointment, call the healthcare provider and tell them that you have, or are being evaluated for, COVID-19 infection. Ask your healthcare provider to call the local or state health department.  Wear a facemask You should wear a facemask that covers your nose and mouth when you are in the same room with other people and when you visit a healthcare provider. People who live with or visit you should also wear a facemask while they are in the same room with you.  Separate yourself from other people in your home As much as possible, you should stay in a different room from other people in your home. Also, you should use a separate bathroom, if available.  Avoid sharing household items You should not share dishes, drinking glasses, cups, eating utensils, towels, bedding, or other items with other people in your home. After using these items, you should wash them thoroughly with soap and water.  Cover your coughs and sneezes Cover your mouth and nose with a tissue when you cough or sneeze, or you can cough or sneeze into your sleeve. Throw used tissues in a lined trash can,  and immediately wash your hands with soap and water for at least 20 seconds or use an alcohol-based hand rub.  Wash your [facility] your hands often and thoroughly with soap and water for at least 20 seconds. You can use an alcohol-based hand sanitizer if soap and water are not available and if your hands  are not visibly dirty. Avoid touching your eyes, nose, and mouth with unwashed hands.   Prevention Steps for Caregivers and Household Members of Individuals Confirmed to have, or Being Evaluated for, COVID-19 Infection Being Cared for in the Home  If you live with, or provide care at home for, a person confirmed to have, or being evaluated for, COVID-19 infection please follow these guidelines to prevent infection:  Follow healthcare providers instructions Make sure that you understand and can help the patient follow any healthcare provider instructions for all care.  Provide for the patients basic needs You should help the patient with basic needs in the home and provide support for getting groceries, prescriptions, and other personal needs.  Monitor the patients symptoms If they are getting sicker, call his or her medical provider and tell them that the patient has, or is being evaluated for, COVID-19 infection. This will help the healthcare providers office take steps to keep other people from getting infected. Ask the healthcare provider to call the local or state health department.  Limit the number of people who have contact with the patient If possible, have only one caregiver for the patient. Other household members should stay in another home or place of residence. If this is not possible, they should stay in another room, or be separated from the patient as much as possible. Use a separate bathroom, if available. Restrict visitors who do not have an essential need to be in the home.  Keep older adults, very young children, and other sick people away from the patient Keep older adults, very young children, and those who have compromised immune systems or chronic health conditions away from the patient. This includes people with chronic heart, lung, or kidney conditions, diabetes, and cancer.  Ensure good ventilation Make sure that shared spaces in the home have good air  flow, such as from an air conditioner or an opened window, weather permitting.  Wash your hands often Wash your hands often and thoroughly with soap and water for at least 20 seconds. You can use an alcohol based hand sanitizer if soap and water are not available and if your hands are not visibly dirty. Avoid touching your eyes, nose, and mouth with unwashed hands. Use disposable paper towels to dry your hands. If not available, use dedicated cloth towels and replace them when they become wet.  Wear a facemask and gloves Wear a disposable facemask at all times in the room and gloves when you touch or have contact with the patients blood, body fluids, and/or secretions or excretions, such as sweat, saliva, sputum, nasal mucus, vomit, urine, or feces.  Ensure the mask fits over your nose and mouth tightly, and do not touch it during use. Throw out disposable facemasks and gloves after using them. Do not reuse. Wash your hands immediately after removing your facemask and gloves. If your personal clothing becomes contaminated, carefully remove clothing and launder. Wash your hands after handling contaminated clothing. Place all used disposable facemasks, gloves, and other waste in a lined container before disposing them with other household waste. Remove gloves and wash your hands immediately after handling these items.  Do  not share dishes, glasses, or other household items with the patient Avoid sharing household items. You should not share dishes, drinking glasses, cups, eating utensils, towels, bedding, or other items with a patient who is confirmed to have, or being evaluated for, COVID-19 infection. After the person uses these items, you should wash them thoroughly with soap and water.  Wash laundry thoroughly Immediately remove and wash clothes or bedding that have blood, body fluids, and/or secretions or excretions, such as sweat, saliva, sputum, nasal mucus, vomit, urine, or feces, on  them. Wear gloves when handling laundry from the patient. Read and follow directions on labels of laundry or clothing items and detergent. In general, wash and dry with the warmest temperatures recommended on the label.  Clean all areas the individual has used often Clean all touchable surfaces, such as counters, tabletops, doorknobs, bathroom fixtures, toilets, phones, keyboards, tablets, and bedside tables, every day. Also, clean any surfaces that may have blood, body fluids, and/or secretions or excretions on them. Wear gloves when cleaning surfaces the patient has come in contact with. Use a diluted bleach solution (e.g., dilute bleach with 1 part bleach and 10 parts water) or a household disinfectant with a label that says EPA-registered for coronaviruses. To make a bleach solution at home, add 1 tablespoon of bleach to 1 quart (4 cups) of water. For a larger supply, add  cup of bleach to 1 gallon (16 cups) of water. Read labels of cleaning products and follow recommendations provided on product labels. Labels contain instructions for safe and effective use of the cleaning product including precautions you should take when applying the product, such as wearing gloves or eye protection and making sure you have good ventilation during use of the product. Remove gloves and wash hands immediately after cleaning.  Monitor yourself for signs and symptoms of illness Caregivers and household members are considered close contacts, should monitor their health, and will be asked to limit movement outside of the home to the extent possible. Follow the monitoring steps for close contacts listed on the symptom monitoring form.   ? If you have additional questions, contact your local health department or call the epidemiologist on call at [phone] (available 24/7). ? This guidance is subject to change. For the most up-to-date guidance from [facility], please refer to their  website: [email or web address]

## 2018-12-21 NOTE — ED Notes (Signed)
Pt became upset and accused nurse of "attitude" when she asked if she could not have the test that results in 1 hour  She is informed that results will be returned in 2-3 days, she will be called if positive and that there is a Producer, television/film/video of reagent and that is whey testing is taking longer  She reports her grandmother had her results in one hour- She is again educated that there is currently a Producer, television/film/video of reagent for covid testing and that her results will be called, if positive, in 2-3 days

## 2018-12-22 LAB — NOVEL CORONAVIRUS, NAA (HOSP ORDER, SEND-OUT TO REF LAB; TAT 18-24 HRS): SARS-CoV-2, NAA: DETECTED — AB

## 2018-12-23 ENCOUNTER — Telehealth: Payer: Self-pay

## 2018-12-23 NOTE — Telephone Encounter (Signed)
Patient called stating that she had a positive COVID-19 result that she has seen on My Chart.  She states that she went to the hospital on Saturday because she felt achy and had a headache.  She had family that had tested positive for COVID-19. Patient was informed that her test was positive and that she could spread the germ to others. Symptoms tier and criteria for ending isolation was reviewed. Good preventative practices were discussed. Patient verbalized understanding of all information. [facility] will be notified.

## 2019-12-18 ENCOUNTER — Other Ambulatory Visit: Payer: Self-pay

## 2019-12-18 ENCOUNTER — Encounter (HOSPITAL_COMMUNITY): Payer: Self-pay | Admitting: *Deleted

## 2019-12-18 ENCOUNTER — Emergency Department (HOSPITAL_COMMUNITY)
Admission: EM | Admit: 2019-12-18 | Discharge: 2019-12-18 | Disposition: A | Discharge status: Home / Self Care | Payer: 59 | Attending: Emergency Medicine | Admitting: Emergency Medicine

## 2019-12-18 DIAGNOSIS — H65193 Other acute nonsuppurative otitis media, bilateral: Secondary | ICD-10-CM

## 2019-12-18 DIAGNOSIS — H748X3 Other specified disorders of middle ear and mastoid, bilateral: Secondary | ICD-10-CM | POA: Insufficient documentation

## 2019-12-18 DIAGNOSIS — H9201 Otalgia, right ear: Secondary | ICD-10-CM | POA: Diagnosis present

## 2019-12-18 MED ORDER — FLUTICASONE PROPIONATE 50 MCG/ACT NA SUSP: 2.0000 | Freq: Every day | NASAL | 0 refills | Status: DC

## 2019-12-18 MED ORDER — CETIRIZINE-PSEUDOEPHEDRINE ER 5-120 MG PO TB12: 1.0000 | ORAL_TABLET | Freq: Every day | ORAL | 0 refills | Status: DC

## 2019-12-18 NOTE — ED Notes (Signed)
Patient discharge teaching given, including activity, diet, follow-up appoints, and medications. Patient verbalized understanding of all discharge instructions.  Vitals are stable. Skin is intact except as charted in most recent assessments. Pt to be escorted out by self, to be driven home by family. 12/18/2019 3:08 PM

## 2019-12-18 NOTE — ED Provider Notes (Signed)
[facility] EMERGENCY DEPARTMENT Provider Note   CSN: [id number] Arrival date & time: 12/18/19  1151     History No chief complaint on file.   Brandi Jenkins is a 24 y.o. female presenting for evaluation of persistent right ear pain, described as sharp and stabbing along with reduced hearing acuity, progressively worsening for the past 3 months. She was initially seen at an Urgent Care in [place], treated for both an external and internal ear infection with abx drops and tablets and when her symptoms did not improved, also completed a 10 day course of augmentin.  She has had no improvement in her symptoms which are worsened at night, unable to sleep despite taking ibuprofen 600 mg.  She denies nasal or sinus congestion or discharge, no ear drainage, no recognized fevers or chills, no dizziness.  She called her provider at the urgent care center and was referred here.    The history is provided by the patient.       Past Medical History:  Diagnosis Date  . BV (bacterial vaginosis) 04/17/2014  . Hx of chlamydia infection   . Irregular periods 04/17/2014  . Obesity 05/20/2014  . Vaginal discharge 04/17/2014  . Vaginal itching 04/17/2014    Patient Active Problem List   Diagnosis Date Noted  . Obesity 05/20/2014  . Vaginal discharge 04/17/2014  . Vaginal itching 04/17/2014  . BV (bacterial vaginosis) 04/17/2014  . Irregular periods 04/17/2014  . Irregular menses 05/06/2013  . Radial head fracture, closed 06/03/2012    Past Surgical History:  Procedure Laterality Date  . TONSILLECTOMY       OB History    Gravida  0   Para  0   Term  0   Preterm  0   AB  0   Living  0     SAB  0   TAB  0   Ectopic  0   Multiple  0   Live Births              Family History  Problem Relation Age of Onset  . Hypertension Maternal Grandmother   . COPD Maternal Grandmother   . Diabetes Maternal Grandfather   . Cancer Maternal Grandfather        lung  . Depression  Mother     Social History   Tobacco Use  . Smoking status: Never Smoker  . Smokeless tobacco: Never Used  Vaping Use  . Vaping Use: Never used  Substance Use Topics  . Alcohol use: No  . Drug use: No    Home Medications Prior to Admission medications   Medication Sig Start Date End Date Taking? Authorizing Provider  azithromycin (ZITHROMAX) 250 MG tablet Take 1 tablet (250 mg total) by mouth daily. Take one tablet daily for 4 days 07/31/17   Burgess Amor, PA-C  cetirizine-pseudoephedrine (ZYRTEC-D) 5-120 MG tablet Take 1 tablet by mouth daily. 12/18/19   Burgess Amor, PA-C  fluticasone (FLONASE) 50 MCG/ACT nasal spray Place 2 sprays into both nostrils daily. 12/18/19   Alley Neils, Raynelle Fanning, PA-C  naproxen (NAPROSYN) 500 MG tablet Take 1 tablet (500 mg total) by mouth 2 (two) times daily with a meal. 04/19/16   Pricilla Loveless, MD  ondansetron (ZOFRAN ODT) 4 MG disintegrating tablet Take 1 tablet (4 mg total) by mouth every 8 (eight) hours as needed for nausea or vomiting. 04/19/16   Pricilla Loveless, MD  traMADol (ULTRAM) 50 MG tablet Take 1 tablet (50 mg total) by mouth every 6 (six)  hours as needed. 06/10/16   Bethann Berkshire, MD    Allergies    Oxycodone  Review of Systems   Review of Systems  Constitutional: Negative for chills and fever.  HENT: Positive for ear pain and hearing loss. Negative for congestion, ear discharge, rhinorrhea, sinus pressure, sinus pain, sore throat, trouble swallowing and voice change.   Eyes: Negative for discharge.  Respiratory: Negative for cough, shortness of breath, wheezing and stridor.   Cardiovascular: Negative for chest pain.  Gastrointestinal: Negative for abdominal pain.  Genitourinary: Negative.   All other systems reviewed and are negative.   Physical Exam Updated Vital Signs BP (!) 120/58 (BP Location: Right Arm)   Pulse 73   Temp 98.4 F (36.9 C) (Oral)   Resp 18   Ht 5\' 4"  (1.626 m)   Wt 114.3 kg   LMP 12/01/2019   SpO2 100%   BMI 43.26  kg/m   Physical Exam Constitutional:      Appearance: She is well-developed.  HENT:     Head: Normocephalic and atraumatic.     Jaw: No tenderness or swelling.     Right Ear: Ear canal and external ear normal. Decreased hearing noted. A middle ear effusion is present.     Left Ear: Ear canal and external ear normal. A middle ear effusion is present.     Ears:     Comments: Mild clear bilateral middle ear effusion with loss of landmarks.  No erythema, no purulence.     Nose: No mucosal edema or rhinorrhea.     Comments: No sinus ttp.    Mouth/Throat:     Mouth: Mucous membranes are moist.     Dentition: Normal dentition.     Pharynx: Oropharynx is clear. Uvula midline. No oropharyngeal exudate or posterior oropharyngeal erythema.     Tonsils: No tonsillar abscesses.  Eyes:     Conjunctiva/sclera: Conjunctivae normal.  Cardiovascular:     Rate and Rhythm: Normal rate.     Heart sounds: Normal heart sounds.  Pulmonary:     Effort: Pulmonary effort is normal. No respiratory distress.     Breath sounds: No wheezing or rales.  Abdominal:     Palpations: Abdomen is soft.     Tenderness: There is no abdominal tenderness.  Musculoskeletal:        General: Normal range of motion.  Lymphadenopathy:     Head:     Right side of head: Tonsillar adenopathy present.     Comments: Tender right tonsillar node without hypertrophy.  Skin:    General: Skin is warm and dry.     Findings: No rash.  Neurological:     Mental Status: She is alert and oriented to person, place, and time.     ED Results / Procedures / Treatments   Labs (all labs ordered are listed, but only abnormal results are displayed) Labs Reviewed - No data to display  EKG None  Radiology No results found.  Procedures Procedures (including critical care time)  Medications Ordered in ED Medications - No data to display  ED Course  I have reviewed the triage vital signs and the nursing notes.  Pertinent labs &  imaging results that were available during my care of the patient were reviewed by me and considered in my medical decision making (see chart for details).    MDM Rules/Calculators/A&P  Suspect eustachian tube dysfunction.  Discussed tx including decongestants/ nasal steroid sprays which were prescribed.  Referral to ENT for further management if sx persist or worsen.  No sign of infection noted.  No mastoid or sinus ttp.    Pt seen by Dr Jacqulyn Bath during this visit at pt request.  Final Clinical Impression(s) / ED Diagnoses Final diagnoses:  Acute MEE (middle ear effusion), bilateral    Rx / DC Orders ED Discharge Orders         Ordered    fluticasone (FLONASE) 50 MCG/ACT nasal spray  Daily        12/18/19 1430    cetirizine-pseudoephedrine (ZYRTEC-D) 5-120 MG tablet  Daily        12/18/19 1430           Burgess Amor, PA-C 12/18/19 1501    Long, Arlyss Repress, MD 12/22/19 1644

## 2019-12-18 NOTE — Discharge Instructions (Signed)
Your exam and history suggests chronic swelling of your eustachian tube which will cause your symptoms and exam findings.  You do not need an antibiotic today but decongestants and steroids (nasal spray) can help resolve this problem and have been prescribed for you.  You will need to see an ENT specialist if this does not improve your symptoms.  Dr. Pollyann Kennedy is listed above for this follow up care if this does not improve your symptoms.  You may continue using the ibuprofen  as well.

## 2019-12-18 NOTE — ED Triage Notes (Signed)
Pt c/o right ear infection x 3 despite taking 2 different antibiotics and ear drops. Pt was told by PCP to come to ED for further evaluation.

## 2020-03-08 DIAGNOSIS — Z8616 Personal history of COVID-19: Secondary | ICD-10-CM | POA: Diagnosis not present

## 2020-03-08 DIAGNOSIS — R0602 Shortness of breath: Secondary | ICD-10-CM | POA: Diagnosis not present

## 2020-03-08 DIAGNOSIS — Z20822 Contact with and (suspected) exposure to covid-19: Secondary | ICD-10-CM | POA: Diagnosis not present

## 2020-03-08 DIAGNOSIS — R059 Cough, unspecified: Secondary | ICD-10-CM | POA: Diagnosis not present

## 2020-03-11 DIAGNOSIS — Z0100 Encounter for examination of eyes and vision without abnormal findings: Secondary | ICD-10-CM | POA: Diagnosis not present

## 2020-04-13 DIAGNOSIS — K649 Unspecified hemorrhoids: Secondary | ICD-10-CM | POA: Diagnosis not present

## 2020-07-21 DIAGNOSIS — F329 Major depressive disorder, single episode, unspecified: Secondary | ICD-10-CM | POA: Diagnosis not present

## 2020-07-21 DIAGNOSIS — B372 Candidiasis of skin and nail: Secondary | ICD-10-CM | POA: Diagnosis not present

## 2020-07-21 DIAGNOSIS — E559 Vitamin D deficiency, unspecified: Secondary | ICD-10-CM | POA: Diagnosis not present

## 2020-07-21 DIAGNOSIS — R69 Illness, unspecified: Secondary | ICD-10-CM | POA: Diagnosis not present

## 2020-07-21 DIAGNOSIS — E669 Obesity, unspecified: Secondary | ICD-10-CM | POA: Diagnosis not present

## 2020-07-28 DIAGNOSIS — Z719 Counseling, unspecified: Secondary | ICD-10-CM | POA: Diagnosis not present

## 2020-07-28 DIAGNOSIS — R102 Pelvic and perineal pain: Secondary | ICD-10-CM | POA: Diagnosis not present

## 2020-07-28 DIAGNOSIS — Z30431 Encounter for routine checking of intrauterine contraceptive device: Secondary | ICD-10-CM | POA: Diagnosis not present

## 2020-08-04 ENCOUNTER — Other Ambulatory Visit: Payer: Self-pay

## 2020-08-04 ENCOUNTER — Emergency Department (HOSPITAL_COMMUNITY)
Admission: EM | Admit: 2020-08-04 | Discharge: 2020-08-05 | Disposition: A | Discharge status: Home / Self Care | Payer: 59 | Attending: Emergency Medicine | Admitting: Emergency Medicine

## 2020-08-04 ENCOUNTER — Encounter (HOSPITAL_COMMUNITY): Payer: Self-pay

## 2020-08-04 ENCOUNTER — Emergency Department (HOSPITAL_COMMUNITY): Payer: 59

## 2020-08-04 DIAGNOSIS — R21 Rash and other nonspecific skin eruption: Secondary | ICD-10-CM | POA: Diagnosis not present

## 2020-08-04 DIAGNOSIS — U071 COVID-19: Secondary | ICD-10-CM | POA: Insufficient documentation

## 2020-08-04 DIAGNOSIS — R000 Tachycardia, unspecified: Secondary | ICD-10-CM | POA: Diagnosis not present

## 2020-08-04 DIAGNOSIS — R059 Cough, unspecified: Secondary | ICD-10-CM | POA: Diagnosis not present

## 2020-08-04 MED ORDER — ACETAMINOPHEN 500 MG PO TABS
1000.0000 mg | ORAL_TABLET | Freq: Once | ORAL | Status: DC
  Filled 2020-08-04: qty 2

## 2020-08-04 MED ORDER — SODIUM CHLORIDE 0.9 % IV BOLUS
1000.0000 mL | Freq: Once | INTRAVENOUS | Status: AC
  Administered 2020-08-05: 1000 mL via INTRAVENOUS

## 2020-08-04 NOTE — ED Triage Notes (Signed)
Pt to er, pt states that she took an at home covid test and it was positive, states that her apple watch was saying that her heart rate was between 160 and 130, states that her sister died from covid last year so she came in to be evaluated.  Pt has cough, resps even and unlabored, pt talking in full sentences.

## 2020-08-05 DIAGNOSIS — R000 Tachycardia, unspecified: Secondary | ICD-10-CM | POA: Diagnosis not present

## 2020-08-05 DIAGNOSIS — U071 COVID-19: Secondary | ICD-10-CM | POA: Diagnosis not present

## 2020-08-05 DIAGNOSIS — Z20822 Contact with and (suspected) exposure to covid-19: Secondary | ICD-10-CM | POA: Diagnosis not present

## 2020-08-05 LAB — HCG, SERUM, QUALITATIVE: Preg, Serum: NEGATIVE

## 2020-08-05 LAB — CBC
HCT: 41.9 % (ref 36.0–46.0)
Hemoglobin: 13.3 g/dL (ref 12.0–15.0)
MCH: 29.4 pg (ref 26.0–34.0)
MCHC: 31.7 g/dL (ref 30.0–36.0)
MCV: 92.7 fL (ref 80.0–100.0)
Platelets: 271 10*3/uL (ref 150–400)
RBC: 4.52 MIL/uL (ref 3.87–5.11)
RDW: 12.7 % (ref 11.5–15.5)
WBC: 9.4 10*3/uL (ref 4.0–10.5)
nRBC: 0 % (ref 0.0–0.2)

## 2020-08-05 LAB — COMPREHENSIVE METABOLIC PANEL
ALT: 25 U/L (ref 0–44)
AST: 21 U/L (ref 15–41)
Albumin: 4 g/dL (ref 3.5–5.0)
Alkaline Phosphatase: 102 U/L (ref 38–126)
Anion gap: 7 (ref 5–15)
BUN: 16 mg/dL (ref 6–20)
CO2: 27 mmol/L (ref 22–32)
Calcium: 8.8 mg/dL — ABNORMAL LOW (ref 8.9–10.3)
Chloride: 105 mmol/L (ref 98–111)
Creatinine, Ser: 0.73 mg/dL (ref 0.44–1.00)
GFR, Estimated: >60 mL/min (ref >60)
Glucose, Bld: 96 mg/dL (ref 70–99)
Potassium: 3.7 mmol/L (ref 3.5–5.1)
Sodium: 139 mmol/L (ref 135–145)
Total Bilirubin: 0.5 mg/dL (ref 0.3–1.2)
Total Protein: 7.3 g/dL (ref 6.5–8.1)

## 2020-08-05 LAB — TROPONIN I (HIGH SENSITIVITY): Troponin I (High Sensitivity): <2 ng/L (ref <18)

## 2020-08-05 LAB — RESP PANEL BY RT-PCR (FLU A&B, COVID) ARPGX2
Influenza A by PCR: NEGATIVE
Influenza B by PCR: NEGATIVE
SARS Coronavirus 2 by RT PCR: POSITIVE — AB

## 2020-08-05 NOTE — Discharge Instructions (Signed)
You were evaluated in the Emergency Department and after careful evaluation, we did not find any emergent condition requiring admission or further testing in the hospital.  Your exam/testing today was overall reassuring.  Symptoms seem to be due to COVID-19.  Your COVID test was positive here in the emergency department.  You will need to isolate yourself at home for the next 4 days.  You will need to the fever free for 24 hours before returning to work.  Will need to wear mask at all times while in public for 5 additional days after that.  Please return to the Emergency Department if you experience any worsening of your condition.  Thank you for allowing [place] to be a part of your care.

## 2020-08-05 NOTE — ED Provider Notes (Signed)
AP-EMERGENCY DEPT [facility] Emergency Department Provider Note MRN:  [id number]  Arrival date & time: 08/05/20     Chief Complaint   Tachycardia   History of Present Illness   Brandi Jenkins is a 25 y.o. year-old female with no pertinent past medical presenting to the ED with chief complaint of tachycardia.  Patient has been feeling ill for the past 2 days.  Malaise, fatigue, dry cough.  Home test was positive for COVID.  Here for evaluation because her heart rate is fast according to her apple watch.  She explains that her sister died from COVID last year, she was otherwise healthy and in her 30s.  Denies any shortness of breath, is endorsing some chest tightness with the cough.  Review of Systems  A complete 10 system review of systems was obtained and all systems are negative except as noted in the HPI and PMH.   Patient's Health History    Past Medical History:  Diagnosis Date  . BV (bacterial vaginosis) 04/17/2014  . Hx of chlamydia infection   . Irregular periods 04/17/2014  . Obesity 05/20/2014  . Vaginal discharge 04/17/2014  . Vaginal itching 04/17/2014    Past Surgical History:  Procedure Laterality Date  . TONSILLECTOMY      Family History  Problem Relation Age of Onset  . Hypertension Maternal Grandmother   . COPD Maternal Grandmother   . Diabetes Maternal Grandfather   . Cancer Maternal Grandfather        lung  . Depression Mother     Social History   Socioeconomic History  . Marital status: Single    Spouse name: Not on file  . Number of children: Not on file  . Years of education: Not on file  . Highest education level: Not on file  Occupational History  . Not on file  Tobacco Use  . Smoking status: Never Smoker  . Smokeless tobacco: Never Used  Vaping Use  . Vaping Use: Never used  Substance and Sexual Activity  . Alcohol use: No  . Drug use: No  . Sexual activity: Yes    Birth control/protection: None  Other Topics Concern  . Not on  file  Social History Narrative  . Not on file   Social Determinants of Health   Financial Resource Strain: Not on file  Food Insecurity: Not on file  Transportation Needs: Not on file  Physical Activity: Not on file  Stress: Not on file  Social Connections: Not on file  Intimate Partner Violence: Not on file     Physical Exam   Vitals:   08/05/20 0100 08/05/20 0200  BP: 100/66 (!) 101/45  Pulse: (!) 116 (!) 123  Resp: (!) 24 (!) 23  Temp:    SpO2: 95% 99%    CONSTITUTIONAL: Well-appearing, NAD NEURO:  Alert and oriented x 3, no focal deficits EYES:  eyes equal and reactive ENT/NECK:  no LAD, no JVD CARDIO: Tachycardic rate, well-perfused, normal S1 and S2 PULM:  CTAB no wheezing or rhonchi GI/GU:  normal bowel sounds, non-distended, non-tender MSK/SPINE:  No gross deformities, no edema SKIN:  no rash, atraumatic PSYCH:  Appropriate speech and behavior  *Additional and/or pertinent findings included in MDM below  Diagnostic and Interventional Summary    EKG Interpretation  Date/Time:  Thursday August 05 2020 01:12:41 EDT Ventricular Rate:  118 PR Interval:  154 QRS Duration: 85 QT Interval:  324 QTC Calculation: 454 R Axis:   72 Text Interpretation: Sinus tachycardia Atrial premature  complexes Borderline Q waves in inferior leads Borderline T abnormalities, anterior leads Confirmed by Kennis Carina [phone]) on 08/05/2020 1:30:43 AM      Labs Reviewed  RESP PANEL BY RT-PCR (FLU A&B, COVID) ARPGX2 - Abnormal; Notable for the following components:      Result Value   SARS Coronavirus 2 by RT PCR POSITIVE (*)    All other components within normal limits  COMPREHENSIVE METABOLIC PANEL - Abnormal; Notable for the following components:   Calcium 8.8 (*)    All other components within normal limits  CBC  HCG, SERUM, QUALITATIVE  TROPONIN I (HIGH SENSITIVITY)  TROPONIN I (HIGH SENSITIVITY)    DG Chest Port 1 View  Final Result      Medications  acetaminophen  (TYLENOL) tablet 1,000 mg (1,000 mg Oral Not Given 08/05/20 0158)  sodium chloride 0.9 % bolus 1,000 mL (1,000 mLs Intravenous New Bag/Given 08/05/20 0116)  sodium chloride 0.9 % bolus 1,000 mL (1,000 mLs Intravenous New Bag/Given 08/05/20 0025)     Procedures  /  Critical Care Procedures  ED Course and Medical Decision Making  I have reviewed the triage vital signs, the nursing notes, and pertinent available records from the EMR.  Listed above are laboratory and imaging tests that I personally ordered, reviewed, and interpreted and then considered in my medical decision making (see below for details).  Patient is vaccinated for COVID-19 and so largely protected from severe illness.  Still given the severe illness and death experienced by her sister, decided to evaluate with laboratory assessment today.  Suspect the tachycardia is related to dehydration and underlying fever, but with the chest discomfort wanted to check troponin to ensure no signs of COVID related myocarditis.  Awaiting labs, providing fluids and will reassess.     Patient's heart rate is improving with fluids, on my evaluation 106.  It seems to go up quite a bit when she wakes and sees that I am in the room.  Suspect a component of whitecoat syndrome.  She is sitting comfortably with no increased work of breathing, no hypoxia, highly doubt PE or other emergent process.  Labs are reassuring.  COVID test has returned positive.  Appropriate for discharge with return precautions.  Elmer Sow. Pilar Plate, MD [facility] Emergency Medicine [facility] [email or web address]  Final Clinical Impressions(s) / ED Diagnoses     ICD-10-CM   1. COVID-19  U07.1     ED Discharge Orders    None       Discharge Instructions Discussed with and Provided to Patient:     Discharge Instructions     You were evaluated in the Emergency Department and after careful evaluation, we did not find any emergent condition requiring admission  or further testing in the hospital.  Your exam/testing today was overall reassuring.  Symptoms seem to be due to COVID-19.  Your COVID test was positive here in the emergency department.  You will need to isolate yourself at home for the next 4 days.  You will need to the fever free for 24 hours before returning to work.  Will need to wear mask at all times while in public for 5 additional days after that.  Please return to the Emergency Department if you experience any worsening of your condition.  Thank you for allowing [place] to be a part of your care.        Sabas Sous, MD 08/05/20 [phone]

## 2020-08-05 NOTE — ED Notes (Signed)
Date and time results received: 08/05/20 0155)  Test: covid Critical Value: +  Name of Provider Notified: Dr. Pilar Plate  Orders Received? Or Actions Taken?: n/a

## 2020-08-06 ENCOUNTER — Telehealth: Payer: Self-pay

## 2020-08-06 NOTE — Telephone Encounter (Signed)
Called to discuss with patient about COVID-19 symptoms and the use of one of the available treatments for those with mild to moderate Covid symptoms and at a high risk of hospitalization.  Pt appears to qualify for outpatient treatment due to co-morbid conditions and/or a member of an at-risk group in accordance with the FDA Emergency Use Authorization.    Symptom onset: 08/02/20 Fatigue, cough Vaccinated: Yes Booster? Yes Immunocompromised? No Qualifiers: Obesity NIH Criteria: Tier 2  Started on Paxlovid. Declines further treatment.   Brandi Jenkins

## 2020-09-14 DIAGNOSIS — H52223 Regular astigmatism, bilateral: Secondary | ICD-10-CM | POA: Diagnosis not present

## 2020-09-14 DIAGNOSIS — H33322 Round hole, left eye: Secondary | ICD-10-CM | POA: Diagnosis not present

## 2020-09-14 DIAGNOSIS — H5213 Myopia, bilateral: Secondary | ICD-10-CM | POA: Diagnosis not present

## 2020-09-16 DIAGNOSIS — Z1283 Encounter for screening for malignant neoplasm of skin: Secondary | ICD-10-CM | POA: Diagnosis not present

## 2020-09-16 DIAGNOSIS — E669 Obesity, unspecified: Secondary | ICD-10-CM | POA: Diagnosis not present

## 2020-09-16 DIAGNOSIS — R69 Illness, unspecified: Secondary | ICD-10-CM | POA: Diagnosis not present

## 2020-10-05 DIAGNOSIS — R229 Localized swelling, mass and lump, unspecified: Secondary | ICD-10-CM | POA: Diagnosis not present

## 2020-10-14 DIAGNOSIS — Z01818 Encounter for other preprocedural examination: Secondary | ICD-10-CM | POA: Diagnosis not present

## 2020-10-14 DIAGNOSIS — Z713 Dietary counseling and surveillance: Secondary | ICD-10-CM | POA: Diagnosis not present

## 2020-10-14 DIAGNOSIS — Z6841 Body Mass Index (BMI) 40.0 and over, adult: Secondary | ICD-10-CM | POA: Diagnosis not present

## 2020-10-21 DIAGNOSIS — Z6841 Body Mass Index (BMI) 40.0 and over, adult: Secondary | ICD-10-CM | POA: Diagnosis not present

## 2020-10-21 DIAGNOSIS — R69 Illness, unspecified: Secondary | ICD-10-CM | POA: Diagnosis not present

## 2020-10-21 DIAGNOSIS — E669 Obesity, unspecified: Secondary | ICD-10-CM | POA: Diagnosis not present

## 2020-10-21 DIAGNOSIS — E559 Vitamin D deficiency, unspecified: Secondary | ICD-10-CM | POA: Diagnosis not present

## 2020-10-21 DIAGNOSIS — Z01818 Encounter for other preprocedural examination: Secondary | ICD-10-CM | POA: Diagnosis not present

## 2020-10-25 DIAGNOSIS — Z6841 Body Mass Index (BMI) 40.0 and over, adult: Secondary | ICD-10-CM | POA: Diagnosis not present

## 2020-11-05 DIAGNOSIS — Z789 Other specified health status: Secondary | ICD-10-CM | POA: Diagnosis not present

## 2022-08-14 ENCOUNTER — Ambulatory Visit: Payer: Commercial Managed Care - PPO | Admitting: Women's Health

## 2022-10-06 ENCOUNTER — Other Ambulatory Visit (HOSPITAL_COMMUNITY): Payer: Self-pay

## 2022-11-16 ENCOUNTER — Ambulatory Visit
Admission: RE | Admit: 2022-11-16 | Discharge: 2022-11-16 | Disposition: A | Discharge status: Home / Self Care | Payer: No Typology Code available for payment source | Source: Ambulatory Visit | Attending: Psychiatry | Admitting: Psychiatry

## 2022-11-16 ENCOUNTER — Telehealth: Payer: Self-pay | Admitting: Psychiatry

## 2022-11-16 ENCOUNTER — Ambulatory Visit: Payer: No Typology Code available for payment source | Admitting: Psychiatry

## 2022-11-16 ENCOUNTER — Encounter: Payer: Self-pay | Admitting: Psychiatry

## 2022-11-16 VITALS — BP 131/87 | HR 79 | Wt 260.5 lb

## 2022-11-16 DIAGNOSIS — H4710 Unspecified papilledema: Secondary | ICD-10-CM

## 2022-11-16 DIAGNOSIS — G4489 Other headache syndrome: Secondary | ICD-10-CM

## 2022-11-16 MED ORDER — GADOPICLENOL 0.5 MMOL/ML IV SOLN
10.0000 mL | Freq: Once | INTRAVENOUS | Status: AC | PRN
  Administered 2022-11-16: 10 mL via INTRAVENOUS

## 2022-11-16 MED ORDER — IOPAMIDOL (ISOVUE-370) INJECTION 76%
500.0000 mL | Freq: Once | INTRAVENOUS | Status: AC | PRN
  Administered 2022-11-16: 75 mL via INTRAVENOUS

## 2022-11-16 MED ORDER — RIZATRIPTAN BENZOATE 10 MG PO TABS: 10.0000 mg | ORAL_TABLET | ORAL | 6 refills | Status: AC | PRN

## 2022-11-16 NOTE — Telephone Encounter (Signed)
sent to GI they obtain auth [id number]

## 2022-11-16 NOTE — Progress Notes (Signed)
Referring:  Illene Labrador, OD [address] [place],  [place] [address]  PCP: Tennis Ship, NP  Neurology was asked to evaluate Brandi Jenkins, a 27 year old female for a chief complaint of headaches.  Our recommendations of care will be communicated by shared medical record.    CC:  headaches  History provided from self  HPI:  Medical co-morbidities: ADHD  The patient presents for evaluation of headaches  which began one month ago. Had headaches as a child, but these improved with glasses. Otherwise no significant headache history. Headaches come and go but she has a headache every day. She often wakes up with them. Headaches fluctuate in severity. They are described as occipital pressure with associated photophobia and nausea. Denies pulsatile tinnitus or vision changes. Saw her optometrist for a routine eye exam who noted blurred disc margins.   Notes that her grandmother has a history of a brain tumor.  Headache History: Onset: 1 month ago Triggers: bending forward Aura: none Location: occiput Quality/Description: pressure Associated Symptoms:  Photophobia: yes  Phonophobia: no  Nausea: yes Worse with activity?: yes Duration of headaches: several hours  Headache days per month: 30 Headache free days per month: 0  Current Treatment: Abortive Tylenol ibuprofen  Preventative none  Prior Therapies                                 Tylenol ibuprofen   LABS: CBC    Component Value Date/Time   WBC 9.4 08/04/2020 2328   RBC 4.52 08/04/2020 2328   HGB 13.3 08/04/2020 2328   HCT 41.9 08/04/2020 2328   PLT 271 08/04/2020 2328   MCV 92.7 08/04/2020 2328   MCH 29.4 08/04/2020 2328   MCHC 31.7 08/04/2020 2328   RDW 12.7 08/04/2020 2328   LYMPHSABS 4.0 06/10/2016 2205   MONOABS 0.9 06/10/2016 2205   EOSABS 0.1 06/10/2016 2205   BASOSABS 0.0 06/10/2016 2205      Latest Ref Rng & Units 08/04/2020   12:30 AM 06/10/2016   10:05 PM 04/19/2016    7:37 AM   CMP  Glucose 70 - 99 mg/dL 96  86  92   BUN 6 - 20 mg/dL 16  18  17    Creatinine 0.44 - 1.00 mg/dL 4.78  2.95  6.21   Sodium 135 - 145 mmol/L 139  138  138   Potassium 3.5 - 5.1 mmol/L 3.7  3.7  4.0   Chloride 98 - 111 mmol/L 105  106  104   CO2 22 - 32 mmol/L 27  26  25    Calcium 8.9 - 10.3 mg/dL 8.8  8.9  9.1   Total Protein 6.5 - 8.1 g/dL 7.3   7.9   Total Bilirubin 0.3 - 1.2 mg/dL 0.5   0.7   Alkaline Phos 38 - 126 U/L 102   96   AST 15 - 41 U/L 21   26   ALT 0 - 44 U/L 25   22      IMAGING:  CTH 2022 unremarkable  Current Outpatient Medications on File Prior to Visit  Medication Sig Dispense Refill   lisdexamfetamine (VYVANSE) 30 MG capsule Take 30 mg by mouth daily.     Magnesium 250 MG TABS Take 250 mg by mouth at bedtime.     azithromycin (ZITHROMAX) 250 MG tablet Take 1 tablet (250 mg total) by mouth daily. Take one tablet daily  for 4 days 4 tablet 0   cetirizine-pseudoephedrine (ZYRTEC-D) 5-120 MG tablet Take 1 tablet by mouth daily. 30 tablet 0   fluticasone (FLONASE) 50 MCG/ACT nasal spray Place 2 sprays into both nostrils daily. 18.2 mL 0   naproxen (NAPROSYN) 500 MG tablet Take 1 tablet (500 mg total) by mouth 2 (two) times daily with a meal. 14 tablet 0   ondansetron (ZOFRAN ODT) 4 MG disintegrating tablet Take 1 tablet (4 mg total) by mouth every 8 (eight) hours as needed for nausea or vomiting. 8 tablet 0   traMADol (ULTRAM) 50 MG tablet Take 1 tablet (50 mg total) by mouth every 6 (six) hours as needed. 15 tablet 0   No current facility-administered medications on file prior to visit.     Allergies: Allergies  Allergen Reactions   Oxycodone Itching    Family History: Family History  Problem Relation Age of Onset   Hypertension Maternal Grandmother    COPD Maternal Grandmother    Diabetes Maternal Grandfather    Cancer Maternal Grandfather        lung   Depression Mother    Grandmother has a history of brain tumor  Past Medical History: Past  Medical History:  Diagnosis Date   BV (bacterial vaginosis) 04/17/2014   Hx of chlamydia infection    Irregular periods 04/17/2014   Obesity 05/20/2014   Vaginal discharge 04/17/2014   Vaginal itching 04/17/2014    Past Surgical History Past Surgical History:  Procedure Laterality Date   TONSILLECTOMY      Social History: Social History   Tobacco Use   Smoking status: Never   Smokeless tobacco: Never  Vaping Use   Vaping status: Never Used  Substance Use Topics   Alcohol use: No   Drug use: No    ROS: Negative for fevers, chills. Positive for headaches. All other systems reviewed and negative unless stated otherwise in HPI.   Physical Exam:   Vital Signs: BP 131/87 (BP Location: Right Arm, Patient Position: Sitting, Cuff Size: Large)   Pulse 79   Wt 260 lb 8 oz (118.2 kg)   BMI 44.71 kg/m  GENERAL: well appearing,in no acute distress,alert SKIN:  Color, texture, turgor normal. No rashes or lesions HEAD:  Normocephalic/atraumatic. CV:  RRR RESP: Normal respiratory effort  NEUROLOGICAL: Mental Status: Alert, oriented to person, place and time,Follows commands Cranial Nerves: PERRL, blurred disc margins OU, visual fields intact to confrontation, extraocular movements intact, facial sensation intact, no facial droop or ptosis, hearing grossly intact, no dysarthria, palate elevate symmetrically Motor: muscle strength 5/5 both upper and lower extremities Reflexes: 2+ throughout Sensation: intact to light touch all 4 extremities Coordination: Finger-to- nose-finger intact bilaterally Gait: normal-based   IMPRESSION: 27 year old female with a history of ADHD who presents for evaluation of headaches and papilledema. Will order MRI/CTV brain to rule out underlying structural causes of increased intracranial pressure. Discussed LP as a next step if imaging is unrevealing. She would like an as-needed medication to take for her headaches while undergoing testing. Will start  Maxalt as needed for her headaches with migrainous features, with the plan to start Diamox for prevention if testing is suggestive of IIH.   PLAN: -MRI brain, CTV head -Start Maxalt 10 mg PRN -Will plan for LP if imaging unrevealing/suggestive of IIH   I spent a total of 28 minutes chart reviewing and counseling the patient. Headache education was done. Discussed treatment options including preventive and acute medications. Discussed medication overuse headache and  to limit use of acute treatments to no more than 2 days/week or 10 days/month. Discussed medication side effects, adverse reactions and drug interactions. Written educational materials and patient instructions outlining all of the above were given.  Follow-up: 6 months, or sooner if needed   Ocie Doyne, MD 11/16/2022   9:46 AM

## 2022-11-21 ENCOUNTER — Other Ambulatory Visit: Payer: Self-pay | Admitting: Psychiatry

## 2022-11-21 DIAGNOSIS — H4710 Unspecified papilledema: Secondary | ICD-10-CM

## 2022-11-30 ENCOUNTER — Telehealth: Payer: Self-pay | Admitting: Psychiatry

## 2022-11-30 MED ORDER — LORAZEPAM 0.5 MG PO TABS: ORAL_TABLET | ORAL | 0 refills | Status: AC

## 2022-11-30 NOTE — Telephone Encounter (Signed)
Called pt. Relayed Dr. Quentin Mulling message. She verbalized understanding. She will make sure to have a driver to and from test. Understands medication may make her drowsy.

## 2022-11-30 NOTE — Telephone Encounter (Signed)
Rx for ativan sent to her pharmacy. She can take this 30 minutes prior to LP and should have someone drive her

## 2022-11-30 NOTE — Telephone Encounter (Signed)
Pt called wanting to know if provider can call in something for her to relax during the LP procedure she has scheduled. Please advise.

## 2022-12-04 NOTE — Discharge Instructions (Signed)

## 2022-12-05 ENCOUNTER — Ambulatory Visit
Admission: RE | Admit: 2022-12-05 | Discharge: 2022-12-05 | Disposition: A | Discharge status: Home / Self Care | Payer: No Typology Code available for payment source | Source: Ambulatory Visit | Attending: Psychiatry

## 2022-12-05 VITALS — BP 122/77 | HR 76

## 2022-12-05 DIAGNOSIS — H4710 Unspecified papilledema: Secondary | ICD-10-CM

## 2022-12-05 DIAGNOSIS — E669 Obesity, unspecified: Secondary | ICD-10-CM

## 2022-12-05 LAB — PROTEIN, CSF: Total Protein, CSF: 28 mg/dL (ref 15–45)

## 2022-12-05 LAB — CSF CELL COUNT WITH DIFFERENTIAL
RBC Count, CSF: 0 {cells}/uL
TOTAL NUCLEATED CELL: 4 {cells}/uL (ref 0–5)

## 2022-12-05 LAB — GLUCOSE, CSF: Glucose, CSF: 55 mg/dL (ref 40–80)

## 2022-12-07 ENCOUNTER — Telehealth: Payer: Self-pay | Admitting: *Deleted

## 2022-12-07 MED ORDER — ACETAZOLAMIDE ER 500 MG PO CP12: ORAL_CAPSULE | ORAL | 3 refills | Status: AC

## 2022-12-07 NOTE — Telephone Encounter (Signed)
Per Dr. Frances Furbish:  "Please call patient and advise her that her recent lumbar puncture showed a mildly elevated spinal fluid pressure.  As she had discussed with Dr. Delena Bali recently, I recommend that we start her on a medication to keep the spinal fluid pressure at bay, this medication is called Diamox (generic name: acetazolamide) 500 mg strength: Take one pill each night at bedtime for 1 week, then 1 pill twice daily thereafter. Common side effects reported are: Dizziness, lightheadedness, increase in urine output, blurry vision, dry mouth, drowsiness, loss of appetite, upset stomach, headache and tiredness, tingling in the hands and feet and change in taste, especially with carbonated sodas. Most side effects are transient especially during the first few days as the body adjusts to the medication.   If patient agreeable, please send prescription for Diamox 500 mg strength 1 pill twice daily, #60 with 3 refills to her pharmacy of choice and provide her with above instructions.  Also, thus far, routine laboratory studies on her spinal fluid are unremarkable. Dr. Frances Furbish"    I called pt at [phone]. Relayed above message. She verbalized understanding and aggreeable to treatment plan. I e-scribed rx to below pharmacy per pt request. She will call back if she has any trouble picking up the prescription.  [id number] [place], [place] - [address] AT [place] [address]

## 2022-12-21 ENCOUNTER — Telehealth: Payer: Self-pay | Admitting: *Deleted

## 2022-12-21 NOTE — Telephone Encounter (Addendum)
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 Pt called to report for the last 6 days c/o diarrhea. Pt states she is staying well hydrated with fluids, she skipped her dose this morning and no diarrhea. She has not tried anti diarrheal medications, pt said she use to take Pepcid and it caused constipation so she was nervous about taking anti diarrheal medication. She wanted to report this to figure out the next step with medication.   Please advise

## 2022-12-21 NOTE — Telephone Encounter (Signed)
Called pt. Relayed Dr. Richrd Humbles message. She is hesitant to stop medication completely. She tolerated 500mg  po every day well and so far, medication has controlled headaches. Diarrhea started when she stated 1 po BID. She also recently started back on Wegovy that has a lot of GI SE. Due for next injection of this Sat. She plans to hold [facility]. I placed on hold and spoke with Dr. Frances Furbish. She recommends pt stay on 1 po every day for the next 1-2 wks to see how she tolerates. May then be able to increase her back to 1 po BID to see how she tolerates. Pt will call in 1-2 wk to provide update on how she is tolerating. She will call before then if she continues to have SE.

## 2022-12-21 NOTE — Telephone Encounter (Signed)
We can also consider reducing the Diamox to once daily for the next few weeks and go up slower. Please discuss with patient.

## 2023-01-25 ENCOUNTER — Emergency Department (HOSPITAL_COMMUNITY): Payer: No Typology Code available for payment source

## 2023-01-25 ENCOUNTER — Other Ambulatory Visit: Payer: Self-pay

## 2023-01-25 ENCOUNTER — Encounter (HOSPITAL_COMMUNITY): Payer: Self-pay | Admitting: *Deleted

## 2023-01-25 ENCOUNTER — Emergency Department (HOSPITAL_COMMUNITY)
Admission: EM | Admit: 2023-01-25 | Discharge: 2023-01-25 | Disposition: A | Discharge status: Home / Self Care | Payer: No Typology Code available for payment source | Attending: Emergency Medicine | Admitting: Emergency Medicine

## 2023-01-25 DIAGNOSIS — M79604 Pain in right leg: Secondary | ICD-10-CM | POA: Diagnosis not present

## 2023-01-25 DIAGNOSIS — R600 Localized edema: Secondary | ICD-10-CM | POA: Insufficient documentation

## 2023-01-25 DIAGNOSIS — R079 Chest pain, unspecified: Secondary | ICD-10-CM | POA: Diagnosis present

## 2023-01-25 LAB — BASIC METABOLIC PANEL
Anion gap: 9 (ref 5–15)
BUN: 13 mg/dL (ref 6–20)
CO2: 27 mmol/L (ref 22–32)
Calcium: 8.8 mg/dL — ABNORMAL LOW (ref 8.9–10.3)
Chloride: 101 mmol/L (ref 98–111)
Creatinine, Ser: 0.69 mg/dL (ref 0.44–1.00)
GFR, Estimated: >60 mL/min (ref >60)
Glucose, Bld: 89 mg/dL (ref 70–99)
Potassium: 4.4 mmol/L (ref 3.5–5.1)
Sodium: 137 mmol/L (ref 135–145)

## 2023-01-25 LAB — POC URINE PREG, ED: Preg Test, Ur: NEGATIVE

## 2023-01-25 LAB — TROPONIN I (HIGH SENSITIVITY): Troponin I (High Sensitivity): <2 ng/L (ref <18)

## 2023-01-25 LAB — CBC
HCT: 40.9 % (ref 36.0–46.0)
Hemoglobin: 13 g/dL (ref 12.0–15.0)
MCH: 29.7 pg (ref 26.0–34.0)
MCHC: 31.8 g/dL (ref 30.0–36.0)
MCV: 93.6 fL (ref 80.0–100.0)
Platelets: 231 10*3/uL (ref 150–400)
RBC: 4.37 MIL/uL (ref 3.87–5.11)
RDW: 13.5 % (ref 11.5–15.5)
WBC: 9.2 10*3/uL (ref 4.0–10.5)
nRBC: 0 % (ref 0.0–0.2)

## 2023-01-25 LAB — D-DIMER, QUANTITATIVE: D-Dimer, Quant: <0.27 ug{FEU}/mL (ref 0.00–0.50)

## 2023-01-25 MED ORDER — LIDOCAINE 5 % EX PTCH
1.0000 | MEDICATED_PATCH | CUTANEOUS | Status: DC
  Administered 2023-01-25: 1 via TRANSDERMAL
  Filled 2023-01-25: qty 1

## 2023-01-25 MED ORDER — HYDROCODONE-ACETAMINOPHEN 5-325 MG PO TABS
1.0000 | ORAL_TABLET | Freq: Once | ORAL | Status: AC
  Administered 2023-01-25: 1 via ORAL
  Filled 2023-01-25: qty 1

## 2023-01-25 MED ORDER — KETOROLAC TROMETHAMINE 15 MG/ML IJ SOLN
15.0000 mg | Freq: Once | INTRAMUSCULAR | Status: DC
  Filled 2023-01-25: qty 1

## 2023-01-25 NOTE — ED Triage Notes (Signed)
Pt c/o left side chest pain that started yesterday  Pt describes the pain as a stabbing pain and states she feels like she is unable to take a deep breath

## 2023-01-25 NOTE — Discharge Instructions (Signed)
You were seen for chest pain in the emergency department.   At home, please take over the counter tylenol, ibuprofen, and lidocaine patches for your pain.    Check your MyChart online for the results of any tests that had not resulted by the time you left the emergency department.   Follow-up with your primary doctor in 2-3 days regarding your visit.    Return immediately to the emergency department if you experience any of the following: worsening pain, difficulty breathing, or any other concerning symptoms.    Thank you for visiting our Emergency Department. It was a pleasure taking care of you today.

## 2023-01-25 NOTE — ED Provider Notes (Signed)
Brandi Jenkins EMERGENCY DEPARTMENT AT [facility] Provider Note   CSN: [id number] Arrival date & time: 01/25/23  2952     History  Chief Complaint  Patient presents with   Chest Pain    Brandi Jenkins is a 27 y.o. female.  27 year old female with a history of IIH and elevated BMI who presents to the emergency department with chest pain.  Patient reports that since yesterday has been having sharp chest pain on her left side.  Says that it is intermittent.  Worsened with deep breathing.  Denies any fevers, runny nose, sore throat, cough or shortness of breath.  No diaphoresis.  Pain is not exertional or positional.  Says that her right lower extremity has been having some pain for the last few weeks.  No swelling of her leg though.  No history of DVT or PE or cancer.  Not on OCPs or hormones.       Home Medications Prior to Admission medications   Medication Sig Start Date End Date Taking? Authorizing Provider  acetaZOLAMIDE ER (DIAMOX) 500 MG capsule Take 1 capsule (500 mg total) by mouth at bedtime for 7 days, THEN 1 capsule (500 mg total) 2 (two) times daily. Patient taking differently: Take 1 capsule (500 mg total) by mouth at bedtime for 7 days, THEN 1 capsule (500 mg total) 2 (two) times daily. (Taking once per day-12/21/22) 12/07/22 12/14/23  Huston Foley, MD  lisdexamfetamine (VYVANSE) 30 MG capsule Take 30 mg by mouth daily.    [provider]  LORazepam (ATIVAN) 0.5 MG tablet Take 1-2 pills 30 minutes prior to spinal tap 11/30/22   Ocie Doyne, MD  Magnesium 250 MG TABS Take 250 mg by mouth at bedtime.    [provider]  rizatriptan (MAXALT) 10 MG tablet Take 1 tablet (10 mg total) by mouth as needed for migraine. May repeat in 2 hours if needed 11/16/22   Ocie Doyne, MD      Allergies    Oxycodone    Review of Systems   Review of Systems  Physical Exam Updated Vital Signs BP (!) 122/93 (BP Location: Right Arm)   Pulse 79   Temp 98  F (36.7 C)   Resp 16   Ht 5\' 4"  (1.626 m)   Wt 117.9 kg   LMP 12/04/2022   SpO2 99%   BMI 44.63 kg/m  Physical Exam Vitals and nursing note reviewed.  Constitutional:      General: She is not in acute distress.    Appearance: She is well-developed.  HENT:     Head: Normocephalic and atraumatic.     Right Ear: External ear normal.     Left Ear: External ear normal.     Nose: Nose normal.  Eyes:     Extraocular Movements: Extraocular movements intact.     Conjunctiva/sclera: Conjunctivae normal.     Pupils: Pupils are equal, round, and reactive to light.  Cardiovascular:     Rate and Rhythm: Normal rate and regular rhythm.     Heart sounds: No murmur heard.    Comments: Chest pain not reproducible.  No rash Pulmonary:     Effort: Pulmonary effort is normal. No respiratory distress.     Breath sounds: Normal breath sounds.  Abdominal:     General: Abdomen is flat. There is no distension.     Palpations: Abdomen is soft. There is no mass.     Tenderness: There is no abdominal tenderness. There is no guarding.  Musculoskeletal:     Cervical back: Normal range of motion and neck supple.     Right lower leg: Edema (2+) present.     Left lower leg: Edema (2+) present.  Skin:    General: Skin is warm and dry.  Neurological:     Mental Status: She is alert and oriented to person, place, and time. Mental status is at baseline.  Psychiatric:        Mood and Affect: Mood normal.     ED Results / Procedures / Treatments   Labs (all labs ordered are listed, but only abnormal results are displayed) Labs Reviewed  BASIC METABOLIC PANEL - Abnormal; Notable for the following components:      Result Value   Calcium 8.8 (*)    All other components within normal limits  CBC  D-DIMER, QUANTITATIVE  POC URINE PREG, ED  TROPONIN I (HIGH SENSITIVITY)    EKG EKG Interpretation Date/Time:  Thursday January 25 2023 08:20:33 EST Ventricular Rate:  76 PR Interval:  134 QRS  Duration:  97 QT Interval:  371 QTC Calculation: 418 R Axis:   65  Text Interpretation: Sinus rhythm Confirmed by Vonita Moss [phone]) on 01/25/2023 9:39:23 AM  Radiology DG Chest 2 View  Result Date: 01/25/2023 CLINICAL DATA:  chest pain EXAM: CHEST - 2 VIEW COMPARISON:  Chest radiograph dated August 04, 2020. FINDINGS: The heart size and mediastinal contours are within normal limits. Both lungs are clear. No pleural effusion pneumothorax. The visualized skeletal structures are unremarkable. IMPRESSION: No active cardiopulmonary disease. Electronically Signed   By: Hart Robinsons M.D.   On: 01/25/2023 09:11    Procedures Procedures    Medications Ordered in ED Medications  HYDROcodone-acetaminophen (NORCO/VICODIN) 5-325 MG per tablet 1 tablet (1 tablet Oral Given 01/25/23 1018)    ED Course/ Medical Decision Making/ A&P                                 Medical Decision Making Amount and/or Complexity of Data Reviewed Labs: ordered. Radiology: ordered.  Risk Prescription drug management.   Brandi Jenkins is a 27 y.o. female with comorbidities that complicate the patient evaluation including IIH and elevated BMI who presents to the emergency department with chest pain   Initial Ddx:  Pleurisy, costochondritis, DVT/PE, MI, pericarditis  MDM/Course:  Patient presents emergency department with pleuritic chest pain.  Also has been having some leg pain recently but no swelling.  EKG and troponins without signs of MI.  Initial troponin is undetectable and since she has been having symptoms for over 5 hours were expected to be positive if it was from an MI.  Do not feel that repeat is indicated at this time.  D-dimer also sent given her leg pain and pleuritic chest pain but it was WNL making DVT/PE highly unlikely.  Upon re-evaluation was feeling better after Norco and lidocaine patch.  Suspect that she likely has costochondritis or MSK etiology of her pain.  Will have her follow-up  with her primary doctor in several days.  This patient presents to the ED for concern of complaints listed in HPI, this involves an extensive number of treatment options, and is a complaint that carries with it a high risk of complications and morbidity. Disposition including potential need for admission considered.   Dispo: DC Home. Return precautions discussed including, but not limited to, those listed in the AVS. Allowed pt time to ask  questions which were answered fully prior to dc.  Records reviewed Outpatient Clinic Notes The following labs were independently interpreted: Chemistry and show no acute abnormality I independently reviewed the following imaging with scope of interpretation limited to determining acute life threatening conditions related to emergency care: Chest x-ray and agree with the radiologist interpretation with the following exceptions: none I personally reviewed and interpreted cardiac monitoring: normal sinus rhythm  I personally reviewed and interpreted the pt's EKG: see above for interpretation  I have reviewed the patients home medications and made adjustments as needed  Portions of this note were generated with Dragon dictation software. Dictation errors may occur despite best attempts at proofreading.           Final Clinical Impression(s) / ED Diagnoses Final diagnoses:  Nonspecific chest pain    Rx / DC Orders ED Discharge Orders     None         Rondel Baton, MD 01/26/23 2023

## 2023-02-13 ENCOUNTER — Encounter (HOSPITAL_COMMUNITY): Payer: Self-pay

## 2023-02-13 ENCOUNTER — Encounter: Payer: Self-pay | Admitting: Neurology

## 2023-02-13 ENCOUNTER — Telehealth: Payer: Self-pay | Admitting: Psychiatry

## 2023-02-13 ENCOUNTER — Emergency Department (HOSPITAL_COMMUNITY)
Admission: EM | Admit: 2023-02-13 | Discharge: 2023-02-13 | Discharge status: Against Medical Advice | Payer: No Typology Code available for payment source | Attending: Emergency Medicine | Admitting: Emergency Medicine

## 2023-02-13 ENCOUNTER — Other Ambulatory Visit: Payer: Self-pay

## 2023-02-13 DIAGNOSIS — R519 Headache, unspecified: Secondary | ICD-10-CM | POA: Insufficient documentation

## 2023-02-13 DIAGNOSIS — Z5321 Procedure and treatment not carried out due to patient leaving prior to being seen by health care provider: Secondary | ICD-10-CM | POA: Diagnosis not present

## 2023-02-13 LAB — COMPREHENSIVE METABOLIC PANEL
ALT: 22 U/L (ref 0–44)
AST: 19 U/L (ref 15–41)
Albumin: 4 g/dL (ref 3.5–5.0)
Alkaline Phosphatase: 96 U/L (ref 38–126)
Anion gap: 9 (ref 5–15)
BUN: 16 mg/dL (ref 6–20)
CO2: 25 mmol/L (ref 22–32)
Calcium: 9.1 mg/dL (ref 8.9–10.3)
Chloride: 104 mmol/L (ref 98–111)
Creatinine, Ser: 0.8 mg/dL (ref 0.44–1.00)
GFR, Estimated: >60 mL/min (ref >60)
Glucose, Bld: 103 mg/dL — ABNORMAL HIGH (ref 70–99)
Potassium: 4.2 mmol/L (ref 3.5–5.1)
Sodium: 138 mmol/L (ref 135–145)
Total Bilirubin: 0.5 mg/dL (ref <1.2)
Total Protein: 7.8 g/dL (ref 6.5–8.1)

## 2023-02-13 LAB — CBC
HCT: 42.9 % (ref 36.0–46.0)
Hemoglobin: 13.3 g/dL (ref 12.0–15.0)
MCH: 29.3 pg (ref 26.0–34.0)
MCHC: 31 g/dL (ref 30.0–36.0)
MCV: 94.5 fL (ref 80.0–100.0)
Platelets: 286 10*3/uL (ref 150–400)
RBC: 4.54 MIL/uL (ref 3.87–5.11)
RDW: 13.4 % (ref 11.5–15.5)
WBC: 9.6 10*3/uL (ref 4.0–10.5)
nRBC: 0 % (ref 0.0–0.2)

## 2023-02-13 LAB — HCG, SERUM, QUALITATIVE: Preg, Serum: NEGATIVE

## 2023-02-13 MED ORDER — ACETAMINOPHEN 325 MG PO TABS
650.0000 mg | ORAL_TABLET | Freq: Once | ORAL | Status: AC
  Administered 2023-02-13: 650 mg via ORAL
  Filled 2023-02-13: qty 2

## 2023-02-13 MED ORDER — IBUPROFEN 200 MG PO TABS
400.0000 mg | ORAL_TABLET | Freq: Once | ORAL | Status: AC | PRN
  Administered 2023-02-13: 400 mg via ORAL
  Filled 2023-02-13: qty 2

## 2023-02-13 NOTE — Telephone Encounter (Addendum)
Pt sent a MyChart message today while at work: Have copied MyChart to this message:   I stopped taking the acetazolamide because the diarrhea kept occurring. Today, my resting heart rate is 110 and when I bend over it jumps to 120. I have a HA that is on the right side of my head towards the back and my right eye hurts! Please advise.   Have advised patient to go to the Emergency Room. Patient said she would leave work to go to the ER

## 2023-02-13 NOTE — ED Triage Notes (Signed)
Sent by neurology. Patient reports headache, tachycardia, and intermittent nausea and vomiting.

## 2023-02-13 NOTE — Telephone Encounter (Signed)
Pt has called Seeking advise about her blood pressure while in ED, pt is not in agreement with what ED waiting room staff is informing her about her Blood pressure. Pt advised providers here do not have hospital privileges , and that she needs to speak with staff there about concerns.

## 2023-02-13 NOTE — ED Provider Triage Note (Addendum)
Emergency Medicine Provider Triage Evaluation Note  Brandi Jenkins , a 27 y.o. female  was evaluated in triage.  Pt complains of HA.  Review of Systems  Positive: Pain behind r eye, tachy this morning, dizziness, near syncope, elevated BP, N, Hx pseudotumor cerebri, intermittent blurred vision, HA different from usual  Negative: V/D, CP, SOB, fever, chills  Physical Exam  BP (!) 147/108 (BP Location: Left Arm)   Pulse 74   Temp 98 F (36.7 C) (Oral)   Resp 18   Ht 5\' 4"  (1.626 m)   Wt 118.4 kg   LMP 12/04/2022   SpO2 99%   BMI 44.80 kg/m  Gen:   Awake, no distress   Resp:  Normal effort  MSK:   Moves extremities without difficulty  Other:    Medical Decision Making  Medically screening exam initiated at 3:16 PM.  Appropriate orders placed.  Danamarie Kuechler was informed that the remainder of the evaluation will be completed by another provider, this initial triage assessment does not replace that evaluation, and the importance of remaining in the ED until their evaluation is complete.  Patient wishes to leave AGAINST MEDICAL ADVICE. I personally explained need for further testing and my concerns for adverse outcomes if workup is incomplete. Specific concerns explained to patient include worsening symptoms, functional loss, long term sickness and death. Patient states understanding of risks and states they will return if they feel the need to at a later date to receive the recommended care or any other care at any time, regardless of their ability to pay for such care. Patient understands they are able to return at any time. Patient is able to explain back the risks of leaving AMA and still wishes to leave.   Specifically I recommend CT MRI w Contrast to evaluate and exclude papillae edema, mass, etc .  The patient is oriented to person, place, and time, has the capacity to make decisions regarding the medical care offered. The patient speaks coherently and exhibits no evidence of having  an altered level of consciousness or alcohol or drug intoxication to a point that would impair judgment. They respond knowingly to questions about recommended treatment and alternate treatments including no further testing or treatment; participate in diagnostic and treatment decisions by means of rational thought processes; and understand the items of minimum basic medical treatment information with respect to that treatment (the nature and seriousness of the illness, the nature of the treatment, the probable degree and duration of any benefits and risks of any medical intervention that is being recommended, and the consequences of lack of treatment, and the nature, risks, and benefits of any reasonable alternatives).  The patient understands the relevant information of the nature of their medical condition, as well as the risks, benefits, and treatment alternatives (including non-treatment), consequences of refusing care, and can competently communicate a rational explanation about their choice of care options.    Included in AVS was the following message:  You have chosen to leave AGAINST MEDICAL ADVICE. Should you change your mind, you are always welcome and encouraged to return to the ED. You are encouraged to follow-up with, at the very least, a primary care provider, or other similar medical professional on this matter.    Dolphus Jenny, PA-C 02/13/23 1520    Dolphus Jenny, [place] 02/13/23 [phone]

## 2023-02-13 NOTE — Discharge Instructions (Signed)
You have chosen to leave AGAINST MEDICAL ADVICE. Should you change your mind, you are always welcome and encouraged to return to the ED. You are encouraged to follow-up with, at the very least, a primary care provider, or other similar medical professional on this matter.  

## 2023-02-14 ENCOUNTER — Other Ambulatory Visit: Payer: Self-pay | Admitting: Neurology

## 2023-02-14 MED ORDER — TOPIRAMATE 50 MG PO TABS: 50.0000 mg | ORAL_TABLET | Freq: Two times a day (BID) | ORAL | 12 refills | Status: AC

## 2023-02-14 NOTE — Telephone Encounter (Signed)
Start topiramate 50 mg daily.  After 1 to 2 weeks may increase to twice a day.  Meds ordered this encounter  Medications   topiramate (TOPAMAX) 50 MG tablet    Sig: Take 1 tablet (50 mg total) by mouth 2 (two) times daily.    Dispense:  60 tablet    Refill:  12   Suanne Marker, MD 02/14/2023, 4:39 PM Certified in Neurology, Neurophysiology and Neuroimaging  [facility] [address] [place], [place] [address] [phone]

## 2023-02-14 NOTE — Addendum Note (Signed)
Addended by: Joycelyn Schmid R on: 02/14/2023 04:39 PM   Modules accepted: Orders

## 2023-02-14 NOTE — Telephone Encounter (Signed)
Pt called to ask about switching to Topiramate, Per Dr. Marjory Lies, Topiramate 50mg  have been sent to her pharmacy on 02/14/2023. Pt is to take 1 tablet by mouth 1 time daily. Told pt to give it a week and then bump up to 2 tablets per day.

## 2023-02-14 NOTE — Telephone Encounter (Signed)
Pt calling back to check status of this potential medication change. Transferred.

## 2023-06-14 ENCOUNTER — Ambulatory Visit: Payer: No Typology Code available for payment source | Admitting: Neurology

## 2023-06-14 ENCOUNTER — Encounter: Payer: Self-pay | Admitting: Neurology

## 2023-06-21 ENCOUNTER — Other Ambulatory Visit: Payer: Self-pay

## 2023-06-21 ENCOUNTER — Encounter (HOSPITAL_COMMUNITY): Payer: Self-pay | Admitting: *Deleted

## 2023-06-21 ENCOUNTER — Emergency Department (HOSPITAL_COMMUNITY)
Admission: EM | Admit: 2023-06-21 | Discharge: 2023-06-21 | Disposition: A | Discharge status: Home / Self Care | Attending: Emergency Medicine | Admitting: Emergency Medicine

## 2023-06-21 DIAGNOSIS — R519 Headache, unspecified: Secondary | ICD-10-CM | POA: Insufficient documentation

## 2023-06-21 HISTORY — DX: Migraine, unspecified, not intractable, without status migrainosus: G43.909

## 2023-06-21 HISTORY — DX: Benign intracranial hypertension: G93.2

## 2023-06-21 MED ORDER — DIPHENHYDRAMINE HCL 50 MG/ML IJ SOLN
25.0000 mg | Freq: Once | INTRAMUSCULAR | Status: AC
  Administered 2023-06-21: 25 mg via INTRAVENOUS
  Filled 2023-06-21: qty 1

## 2023-06-21 MED ORDER — KETOROLAC TROMETHAMINE 30 MG/ML IJ SOLN
30.0000 mg | Freq: Once | INTRAMUSCULAR | Status: AC
Start: 2023-06-21 — End: 2023-06-21
  Administered 2023-06-21: 30 mg via INTRAVENOUS
  Filled 2023-06-21: qty 1

## 2023-06-21 MED ORDER — METOCLOPRAMIDE HCL 5 MG/ML IJ SOLN
10.0000 mg | Freq: Once | INTRAMUSCULAR | Status: AC
  Administered 2023-06-21: 10 mg via INTRAVENOUS
  Filled 2023-06-21: qty 2

## 2023-06-21 NOTE — Discharge Instructions (Signed)
 Follow-up with your doctor if any problems.

## 2023-06-21 NOTE — ED Provider Notes (Signed)
 [place] EMERGENCY DEPARTMENT AT [facility] Provider Note   CSN: [id number] Arrival date & time: 06/21/23  4098     History  Chief Complaint  Patient presents with   Headache    Brandi Jenkins is a 28 y.o. female.  Patient has a history of headaches.  She complains of her typical migraine headache now  The history is provided by the patient and medical records. No language interpreter was used.  Headache Pain location:  Frontal Quality:  Dull Radiates to:  Does not radiate Severity currently:  8/10 Severity at highest:  8/10 Onset quality:  Sudden Timing:  Constant Progression:  Worsening Chronicity:  New Similar to prior headaches: yes   Context: not activity   Relieved by:  Nothing Associated symptoms: no abdominal pain, no back pain, no congestion, no cough, no diarrhea, no fatigue, no seizures and no sinus pressure        Home Medications Prior to Admission medications   Medication Sig Start Date End Date Taking? Authorizing Provider  acetaZOLAMIDE ER (DIAMOX) 500 MG capsule Take 1 capsule (500 mg total) by mouth at bedtime for 7 days, THEN 1 capsule (500 mg total) 2 (two) times daily. Patient taking differently: Take 1 capsule (500 mg total) by mouth at bedtime for 7 days, THEN 1 capsule (500 mg total) 2 (two) times daily. (Taking once per day-12/21/22) 12/07/22 12/14/23  Huston Foley, MD  lisdexamfetamine (VYVANSE) 30 MG capsule Take 30 mg by mouth daily.    [provider]  LORazepam (ATIVAN) 0.5 MG tablet Take 1-2 pills 30 minutes prior to spinal tap 11/30/22   Ocie Doyne, MD  Magnesium 250 MG TABS Take 250 mg by mouth at bedtime.    [provider]  rizatriptan (MAXALT) 10 MG tablet Take 1 tablet (10 mg total) by mouth as needed for migraine. May repeat in 2 hours if needed 11/16/22   Ocie Doyne, MD  topiramate (TOPAMAX) 50 MG tablet Take 1 tablet (50 mg total) by mouth 2 (two) times daily. 02/14/23   Penumalli, Glenford Bayley, MD       Allergies    Oxycodone    Review of Systems   Review of Systems  Constitutional:  Negative for appetite change and fatigue.  HENT:  Negative for congestion, ear discharge and sinus pressure.   Eyes:  Negative for discharge.  Respiratory:  Negative for cough.   Cardiovascular:  Negative for chest pain.  Gastrointestinal:  Negative for abdominal pain and diarrhea.  Genitourinary:  Negative for frequency and hematuria.  Musculoskeletal:  Negative for back pain.  Skin:  Negative for rash.  Neurological:  Positive for headaches. Negative for seizures.  Psychiatric/Behavioral:  Negative for hallucinations.     Physical Exam Updated Vital Signs BP (!) 114/59 (BP Location: Right Arm)   Pulse 72   Temp 97.8 F (36.6 C) (Oral)   Resp 20   Ht 5\' 4"  (1.626 m)   Wt 113.4 kg   LMP 05/31/2023   SpO2 98%   BMI 42.91 kg/m  Physical Exam Vitals and nursing note reviewed.  Constitutional:      Appearance: She is well-developed.  HENT:     Head: Normocephalic.     Nose: Nose normal.  Eyes:     General: No scleral icterus.    Conjunctiva/sclera: Conjunctivae normal.  Neck:     Thyroid: No thyromegaly.  Cardiovascular:     Rate and Rhythm: Normal rate and regular rhythm.     Heart sounds:  No murmur heard.    No friction rub. No gallop.  Pulmonary:     Breath sounds: No stridor. No wheezing or rales.  Chest:     Chest wall: No tenderness.  Abdominal:     General: There is no distension.     Tenderness: There is no abdominal tenderness. There is no rebound.  Musculoskeletal:        General: Normal range of motion.     Cervical back: Neck supple.  Lymphadenopathy:     Cervical: No cervical adenopathy.  Skin:    Findings: No erythema or rash.  Neurological:     Mental Status: She is alert and oriented to person, place, and time.     Motor: No abnormal muscle tone.     Coordination: Coordination normal.  Psychiatric:        Behavior: Behavior normal.     ED Results  / Procedures / Treatments   Labs (all labs ordered are listed, but only abnormal results are displayed) Labs Reviewed - No data to display  EKG None  Radiology No results found.  Procedures Procedures    Medications Ordered in ED Medications  ketorolac (TORADOL) 30 MG/ML injection 30 mg (30 mg Intravenous Given 06/21/23 1038)  metoCLOPramide (REGLAN) injection 10 mg (10 mg Intravenous Given 06/21/23 1038)  diphenhydrAMINE (BENADRYL) injection 25 mg (25 mg Intravenous Given 06/21/23 1037)    ED Course/ Medical Decision Making/ A&P                                 Medical Decision Making Risk Prescription drug management.   Patient with a migraine headache that has improved with migraine cocktail.  She will follow-up with her PCP as needed        Final Clinical Impression(s) / ED Diagnoses Final diagnoses:  Bad headache    Rx / DC Orders ED Discharge Orders     None         Cheyenne Cotta, MD 06/21/23 [phone]

## 2023-06-21 NOTE — ED Triage Notes (Signed)
 Pt c/o headache x few days. While pt was on her way to work today she had a sharp shooting pain behind her right eye, felt dizzy, and had double vision. Pt reports the sharp pain, dizziness, and double vision is still somewhat present, but not nearly as bad as it was earlier.

## 2023-10-02 ENCOUNTER — Emergency Department (HOSPITAL_COMMUNITY)
Admission: EM | Admit: 2023-10-02 | Discharge: 2023-10-02 | Disposition: A | Discharge status: Home / Self Care | Attending: Emergency Medicine | Admitting: Emergency Medicine

## 2023-10-02 ENCOUNTER — Other Ambulatory Visit: Payer: Self-pay

## 2023-10-02 DIAGNOSIS — M545 Low back pain, unspecified: Secondary | ICD-10-CM | POA: Diagnosis present

## 2023-10-02 DIAGNOSIS — R21 Rash and other nonspecific skin eruption: Secondary | ICD-10-CM | POA: Diagnosis not present

## 2023-10-02 MED ORDER — DIPHENHYDRAMINE HCL 25 MG PO CAPS
25.0000 mg | ORAL_CAPSULE | Freq: Once | ORAL | Status: AC
  Administered 2023-10-02: 25 mg via ORAL
  Filled 2023-10-02: qty 1

## 2023-10-02 MED ORDER — TIZANIDINE HCL 4 MG PO TABS: 4.0000 mg | ORAL_TABLET | Freq: Three times a day (TID) | ORAL | 0 refills | Status: AC | PRN

## 2023-10-02 MED ORDER — TIZANIDINE HCL 4 MG PO TABS
4.0000 mg | ORAL_TABLET | Freq: Once | ORAL | Status: AC
  Administered 2023-10-02: 4 mg via ORAL
  Filled 2023-10-02: qty 1

## 2023-10-02 MED ORDER — NAPROXEN 500 MG PO TABS: 500.0000 mg | ORAL_TABLET | Freq: Two times a day (BID) | ORAL | 0 refills | Status: AC

## 2023-10-02 MED ORDER — KETOROLAC TROMETHAMINE 30 MG/ML IJ SOLN
30.0000 mg | Freq: Once | INTRAMUSCULAR | Status: AC
  Administered 2023-10-02: 30 mg via INTRAMUSCULAR
  Filled 2023-10-02: qty 1

## 2023-10-02 NOTE — ED Provider Notes (Signed)
 [place] EMERGENCY DEPARTMENT AT [facility]  Provider Note  CSN: [id number] Arrival date & time: 10/02/23 0009  History Chief Complaint  Patient presents with   Back Pain    Brandi Jenkins is a 28 y.o. female here with mother reports several hours of lower back pain after she was in the swimming pool. No injuries reported. Pain is worse with movement. Not relieved with OTC meds at home. No fever, bowel or bladder symptoms. No radicular pain/numbness or LE weakness.    Home Medications Prior to Admission medications   Medication Sig Start Date End Date Taking? Authorizing Provider  naproxen  (NAPROSYN ) 500 MG tablet Take 1 tablet (500 mg total) by mouth 2 (two) times daily. 10/02/23  Yes Roselyn Carlin NOVAK, MD  tiZANidine  (ZANAFLEX ) 4 MG tablet Take 1 tablet (4 mg total) by mouth every 8 (eight) hours as needed for muscle spasms. 10/02/23  Yes Roselyn Carlin NOVAK, MD  acetaZOLAMIDE  ER (DIAMOX ) 500 MG capsule Take 1 capsule (500 mg total) by mouth at bedtime for 7 days, THEN 1 capsule (500 mg total) 2 (two) times daily. Patient taking differently: Take 1 capsule (500 mg total) by mouth at bedtime for 7 days, THEN 1 capsule (500 mg total) 2 (two) times daily. (Taking once per day-12/21/22) 12/07/22 12/14/23  Athar, Saima, MD  lisdexamfetamine (VYVANSE) 30 MG capsule Take 30 mg by mouth daily.    [provider]  LORazepam  (ATIVAN ) 0.5 MG tablet Take 1-2 pills 30 minutes prior to spinal tap 11/30/22   Rush Nest, MD  Magnesium  250 MG TABS Take 250 mg by mouth at bedtime.    [provider]  rizatriptan  (MAXALT ) 10 MG tablet Take 1 tablet (10 mg total) by mouth as needed for migraine. May repeat in 2 hours if needed 11/16/22   Rush Nest, MD  topiramate  (TOPAMAX ) 50 MG tablet Take 1 tablet (50 mg total) by mouth 2 (two) times daily. 02/14/23   Penumalli, Eduard SAUNDERS, MD     Allergies    Oxycodone   Review of Systems   Review of Systems Please see HPI for  pertinent positives and negatives  Physical Exam BP (!) 105/56   Pulse 69   Temp 98.1 F (36.7 C) (Oral)   Resp 16   Ht 5' 4 (1.626 m)   Wt 124.7 kg   LMP 09/03/2023   SpO2 98%   BMI 47.20 kg/m   Physical Exam Vitals and nursing note reviewed.  HENT:     Head: Normocephalic.     Nose: Nose normal.  Eyes:     Extraocular Movements: Extraocular movements intact.  Pulmonary:     Effort: Pulmonary effort is normal.  Musculoskeletal:        General: Tenderness (bilateral lumbar paraspinal muscles, no midline tenderness) present. No deformity. Normal range of motion.     Cervical back: Neck supple.  Skin:    Findings: No rash (on exposed skin).  Neurological:     Mental Status: She is alert and oriented to person, place, and time.     Sensory: No sensory deficit.     Motor: No weakness.  Psychiatric:        Mood and Affect: Mood normal.     ED Results / Procedures / Treatments   EKG None  Procedures Procedures  Medications Ordered in the ED Medications  diphenhydrAMINE  (BENADRYL ) capsule 25 mg (has no administration in time range)  ketorolac  (TORADOL ) 30 MG/ML injection 30 mg (30 mg Intramuscular  Given 10/02/23 0036)  tiZANidine  (ZANAFLEX ) tablet 4 mg (4 mg Oral Given 10/02/23 0036)    Initial Impression and Plan  Patient here with MSK low back pain. No red flags. No radicular symptoms. Will give IM toradol  and PO tizanidine  and reassess for improvement.   ED Course   Clinical Course as of 10/02/23 0117  Tue Oct 02, 2023  0112 Patient reports pain is improved. Will give Rx for Naprosyn , tizanidine , recommend rest, heat and PCP follow up, RTED for any other concerns.   [CS]  0116 Prior to discharge patient noted some itching and redness to her R arm, not in area of her toradol  injection on that side. Appears to be a localized dermatitis/hives. Will give a dose of benadryl , but otherwise ready for discharge.  [CS]    Clinical Course User Index [CS] Roselyn Carlin NOVAK, MD     MDM Rules/Calculators/A&P Medical Decision Making Problems Addressed: Acute bilateral low back pain without sciatica: acute illness or injury Rash: acute illness or injury  Risk OTC drugs. Prescription drug management.     Final Clinical Impression(s) / ED Diagnoses Final diagnoses:  Acute bilateral low back pain without sciatica  Rash    Rx / DC Orders ED Discharge Orders          Ordered    naproxen  (NAPROSYN ) 500 MG tablet  2 times daily        10/02/23 0113    tiZANidine  (ZANAFLEX ) 4 MG tablet  Every 8 hours PRN        10/02/23 0113             Roselyn Carlin NOVAK, MD 10/02/23 [phone]

## 2023-10-02 NOTE — ED Notes (Signed)
 ED Provider at bedside.

## 2023-10-02 NOTE — ED Triage Notes (Signed)
 Pt c/o lower back pain that started after she went swimming yesterday. States the pain as progressively gotten worse, has taken Tylenol  and Ibuprofen  with no relief. Ambulatory in triage.   Denies any numbness or tingling.

## 2023-10-03 ENCOUNTER — Encounter (INDEPENDENT_AMBULATORY_CARE_PROVIDER_SITE_OTHER): Payer: Self-pay

## 2024-03-02 ENCOUNTER — Encounter (HOSPITAL_COMMUNITY): Payer: Self-pay

## 2024-03-02 ENCOUNTER — Other Ambulatory Visit: Payer: Self-pay

## 2024-03-02 ENCOUNTER — Emergency Department (HOSPITAL_COMMUNITY)
Admission: EM | Admit: 2024-03-02 | Discharge: 2024-03-02 | Disposition: A | Discharge status: Home / Self Care | Payer: Self-pay | Attending: Emergency Medicine | Admitting: Emergency Medicine

## 2024-03-02 ENCOUNTER — Emergency Department (HOSPITAL_COMMUNITY): Payer: Self-pay

## 2024-03-02 DIAGNOSIS — J101 Influenza due to other identified influenza virus with other respiratory manifestations: Secondary | ICD-10-CM | POA: Insufficient documentation

## 2024-03-02 LAB — RESP PANEL BY RT-PCR (RSV, FLU A&B, COVID)  RVPGX2
Influenza A by PCR: POSITIVE — AB
Influenza B by PCR: NEGATIVE
Resp Syncytial Virus by PCR: NEGATIVE
SARS Coronavirus 2 by RT PCR: NEGATIVE

## 2024-03-02 MED ORDER — OSELTAMIVIR PHOSPHATE 75 MG PO CAPS: 75.0000 mg | ORAL_CAPSULE | Freq: Two times a day (BID) | ORAL | 0 refills | Status: AC

## 2024-03-02 NOTE — Discharge Instructions (Signed)
 Rest make sure you are drinking plenty of fluids.  You may continue using your home medications including your promethazine  with codeine for cough and nausea.  I have prescribed you Tamiflu , you can decide if you want to start this medication, if you choose to try this medicine it should be started as soon as possible to be the most effective.  Get rechecked for any increasing shortness of breath, weakness, any uncontrolled vomiting or diarrhea or dizziness.

## 2024-03-02 NOTE — ED Triage Notes (Signed)
 Pt presents with nausea, fatigue, HA, cough, body aches x 2 days. Pt has positive sick contacts. She has had a negative home COVID19/flu test.

## 2024-03-02 NOTE — ED Provider Notes (Signed)
 " [place] EMERGENCY DEPARTMENT AT [facility] Provider Note   CSN: [id number] Arrival date & time: 03/02/24  1413     Patient presents with: Nausea and Cough   Brandi Jenkins is a 28 y.o. female with no significant past medical history presenting with a 2-day history of flulike symptoms including nausea without emesis, generalized fatigue, generalized headache, nonproductive cough and generalized bodyaches along with subjective fever.  She has been exposed to others with similar symptoms.  She took a home COVID/flu test which she states was negative.  She has taken Phenergan  with codeine which has helped her with her nausea and her cough.   The history is provided by the patient.       Prior to Admission medications  Medication Sig Start Date End Date Taking? Authorizing Provider  oseltamivir  (TAMIFLU ) 75 MG capsule Take 1 capsule (75 mg total) by mouth every 12 (twelve) hours. 03/02/24  Yes Kermitt Harjo, PA-C  acetaZOLAMIDE  ER (DIAMOX ) 500 MG capsule Take 1 capsule (500 mg total) by mouth at bedtime for 7 days, THEN 1 capsule (500 mg total) 2 (two) times daily. Patient taking differently: Take 1 capsule (500 mg total) by mouth at bedtime for 7 days, THEN 1 capsule (500 mg total) 2 (two) times daily. (Taking once per day-12/21/22) 12/07/22 12/14/23  Athar, Saima, MD  lisdexamfetamine (VYVANSE) 30 MG capsule Take 30 mg by mouth daily.    [provider]  LORazepam  (ATIVAN ) 0.5 MG tablet Take 1-2 pills 30 minutes prior to spinal tap 11/30/22   Rush Nest, MD  Magnesium  250 MG TABS Take 250 mg by mouth at bedtime.    [provider]  naproxen  (NAPROSYN ) 500 MG tablet Take 1 tablet (500 mg total) by mouth 2 (two) times daily. 10/02/23   Roselyn Carlin NOVAK, MD  rizatriptan  (MAXALT ) 10 MG tablet Take 1 tablet (10 mg total) by mouth as needed for migraine. May repeat in 2 hours if needed 11/16/22   Rush Nest, MD  tiZANidine  (ZANAFLEX ) 4 MG tablet Take 1 tablet (4  mg total) by mouth every 8 (eight) hours as needed for muscle spasms. 10/02/23   Roselyn Carlin NOVAK, MD  topiramate  (TOPAMAX ) 50 MG tablet Take 1 tablet (50 mg total) by mouth 2 (two) times daily. 02/14/23   Penumalli, Eduard SAUNDERS, MD    Allergies: Alpha-gal, Oxycodone, and Tizanidine     Review of Systems  Constitutional:  Positive for chills, fatigue and fever.  HENT:  Positive for rhinorrhea. Negative for ear pain, sinus pressure, sore throat, trouble swallowing and voice change.   Eyes:  Negative for discharge.  Respiratory:  Positive for cough. Negative for shortness of breath, wheezing and stridor.   Cardiovascular:  Negative for chest pain.  Gastrointestinal:  Positive for nausea. Negative for abdominal pain.  Genitourinary: Negative.   Musculoskeletal:  Positive for myalgias.  Neurological:  Positive for headaches.    Updated Vital Signs BP 132/83 (BP Location: Right Arm)   Pulse (!) 105   Temp 99.5 F (37.5 C) (Oral)   Resp 20   Ht 5' 4 (1.626 m)   Wt 127 kg   LMP  (LMP Unknown)   SpO2 97%   BMI 48.06 kg/m   Physical Exam Vitals and nursing note reviewed.  Constitutional:      Appearance: She is well-developed.     Comments: Temp 99.5  HENT:     Head: Normocephalic and atraumatic.     Nose: Rhinorrhea present.  Mouth/Throat:     Mouth: Mucous membranes are moist.     Pharynx: No posterior oropharyngeal erythema.  Eyes:     Conjunctiva/sclera: Conjunctivae normal.  Cardiovascular:     Rate and Rhythm: Regular rhythm. Tachycardia present.     Heart sounds: Normal heart sounds.  Pulmonary:     Effort: Pulmonary effort is normal.     Breath sounds: Normal breath sounds. No wheezing or rhonchi.  Abdominal:     General: Bowel sounds are normal.     Palpations: Abdomen is soft.     Tenderness: There is no abdominal tenderness.  Musculoskeletal:        General: Normal range of motion.     Cervical back: Normal range of motion.  Skin:    General: Skin is warm  and dry.  Neurological:     Mental Status: She is alert.     (all labs ordered are listed, but only abnormal results are displayed) Labs Reviewed  RESP PANEL BY RT-PCR (RSV, FLU A&B, COVID)  RVPGX2 - Abnormal; Notable for the following components:      Result Value   Influenza A by PCR POSITIVE (*)    All other components within normal limits    EKG: None  Radiology: DG Chest 2 View Result Date: 03/02/2024 CLINICAL DATA:  Shortness of breath, nausea, fatigue, cough, and body aches. EXAM: CHEST - 2 VIEW COMPARISON:  01/25/2023. FINDINGS: The heart size and mediastinal contours are within normal limits. No consolidation, effusion, or pneumothorax is seen. No acute osseous abnormality. IMPRESSION: No active cardiopulmonary disease. Electronically Signed   By: Leita Birmingham M.D.   On: 03/02/2024 17:19     Procedures   Medications Ordered in the ED - No data to display                                  Medical Decision Making Patient presenting with classic flulike symptoms with positive exposure, however at home flu test was negative.  Additional differential would include viral URI, bronchitis, pneumonia.  She has tested positive here for influenza A.  Her chest x-ray is negative for pneumonia.  She was encouraged rest, increase fluid intake, she may continue using her Phenergan  with codeine cough syrup.  We discussed benefits and potential side effects of Tamiflu , she was desirous of starting this medication, prescription given.  Return precautions outlined.  Amount and/or Complexity of Data Reviewed External Data Reviewed: labs.    Details: Respiratory panel positive for influenza A Radiology: ordered.    Details: Chest x-ray reviewed, no pneumonia, no abnormal cardiopulmonary findings.  Risk Prescription drug management.        Final diagnoses:  Influenza A    ED Discharge Orders          Ordered    oseltamivir  (TAMIFLU ) 75 MG capsule  Every 12 hours         03/02/24 1834               Birdena Clarity, PA-C 03/04/24 1026  "
